# Patient Record
Sex: Male | Born: 1955 | Race: White | Hispanic: No | Marital: Married | Smoking: Never smoker
Health system: Southern US, Community
[De-identification: ages and names within clinical notes are randomized; demographics above are authoritative.]

## PROBLEM LIST (undated history)

## (undated) DIAGNOSIS — G8921 Chronic pain due to trauma: Secondary | ICD-10-CM

## (undated) HISTORY — PX: BACK SURGERY: SHX140

## (undated) HISTORY — PX: TOTAL KNEE ARTHROPLASTY: SHX125

## (undated) SURGERY — CARDIOVERSION (CATH LAB)
Anesthesia: Moderate Sedation | Laterality: Right

---

## 1999-05-02 ENCOUNTER — Encounter: Admission: RE | Admit: 1999-05-02 | Discharge: 1999-05-02 | Payer: Self-pay | Admitting: Orthopedic Surgery

## 1999-05-02 ENCOUNTER — Encounter: Payer: Self-pay | Admitting: Orthopedic Surgery

## 1999-05-18 ENCOUNTER — Ambulatory Visit (HOSPITAL_COMMUNITY): Admission: RE | Admit: 1999-05-18 | Discharge: 1999-05-18 | Payer: Self-pay | Admitting: Specialist

## 1999-05-18 ENCOUNTER — Encounter: Payer: Self-pay | Admitting: Specialist

## 1999-05-24 ENCOUNTER — Encounter (INDEPENDENT_AMBULATORY_CARE_PROVIDER_SITE_OTHER): Payer: Self-pay

## 1999-05-24 ENCOUNTER — Inpatient Hospital Stay (HOSPITAL_COMMUNITY): Admission: RE | Admit: 1999-05-24 | Discharge: 1999-05-28 | Payer: Self-pay | Admitting: Orthopedic Surgery

## 1999-07-04 ENCOUNTER — Inpatient Hospital Stay (HOSPITAL_COMMUNITY): Admission: RE | Admit: 1999-07-04 | Discharge: 1999-07-05 | Payer: Self-pay | Admitting: Orthopedic Surgery

## 1999-11-23 ENCOUNTER — Ambulatory Visit (HOSPITAL_COMMUNITY): Admission: RE | Admit: 1999-11-23 | Discharge: 1999-11-23 | Payer: Self-pay | Admitting: Specialist

## 1999-11-30 ENCOUNTER — Encounter: Payer: Self-pay | Admitting: Specialist

## 1999-11-30 ENCOUNTER — Ambulatory Visit (HOSPITAL_COMMUNITY): Admission: RE | Admit: 1999-11-30 | Discharge: 1999-11-30 | Payer: Self-pay | Admitting: Specialist

## 2001-05-25 ENCOUNTER — Encounter: Admission: RE | Admit: 2001-05-25 | Discharge: 2001-05-25 | Payer: Self-pay | Admitting: Orthopaedic Surgery

## 2001-05-25 ENCOUNTER — Encounter: Payer: Self-pay | Admitting: Orthopaedic Surgery

## 2002-03-26 ENCOUNTER — Encounter: Payer: Self-pay | Admitting: Orthopaedic Surgery

## 2002-03-26 ENCOUNTER — Encounter: Admission: RE | Admit: 2002-03-26 | Discharge: 2002-03-26 | Payer: Self-pay | Admitting: Orthopaedic Surgery

## 2002-05-26 ENCOUNTER — Encounter: Payer: Self-pay | Admitting: Orthopaedic Surgery

## 2002-05-28 ENCOUNTER — Encounter: Payer: Self-pay | Admitting: Orthopaedic Surgery

## 2002-05-28 ENCOUNTER — Inpatient Hospital Stay (HOSPITAL_COMMUNITY): Admission: RE | Admit: 2002-05-28 | Discharge: 2002-06-04 | Payer: Self-pay | Admitting: Orthopaedic Surgery

## 2002-05-31 ENCOUNTER — Encounter: Payer: Self-pay | Admitting: Orthopaedic Surgery

## 2003-11-18 ENCOUNTER — Encounter: Admission: RE | Admit: 2003-11-18 | Discharge: 2003-11-18 | Payer: Self-pay | Admitting: Orthopaedic Surgery

## 2004-10-26 ENCOUNTER — Ambulatory Visit: Payer: Self-pay

## 2006-05-26 ENCOUNTER — Emergency Department: Payer: Self-pay | Admitting: Emergency Medicine

## 2008-10-31 ENCOUNTER — Encounter: Admission: RE | Admit: 2008-10-31 | Discharge: 2008-10-31 | Payer: Self-pay | Admitting: Orthopedic Surgery

## 2009-01-23 ENCOUNTER — Encounter: Admission: RE | Admit: 2009-01-23 | Discharge: 2009-01-23 | Payer: Self-pay | Admitting: Orthopaedic Surgery

## 2009-11-30 ENCOUNTER — Inpatient Hospital Stay (HOSPITAL_COMMUNITY): Admission: RE | Admit: 2009-11-30 | Discharge: 2009-12-01 | Payer: Self-pay | Admitting: Neurosurgery

## 2010-09-12 LAB — URINALYSIS, ROUTINE W REFLEX MICROSCOPIC
Bilirubin Urine: NEGATIVE
Glucose, UA: NEGATIVE mg/dL
Hgb urine dipstick: NEGATIVE
Ketones, ur: NEGATIVE mg/dL
Specific Gravity, Urine: 1.02 (ref 1.005–1.030)
pH: 6 (ref 5.0–8.0)

## 2010-09-12 LAB — TYPE AND SCREEN: Antibody Screen: NEGATIVE

## 2010-09-12 LAB — DIFFERENTIAL
Basophils Absolute: 0 10*3/uL (ref 0.0–0.1)
Eosinophils Absolute: 0.2 10*3/uL (ref 0.0–0.7)
Eosinophils Relative: 2 % (ref 0–5)
Lymphs Abs: 2.6 10*3/uL (ref 0.7–4.0)
Neutrophils Relative %: 62 % (ref 43–77)

## 2010-09-12 LAB — CBC
Hemoglobin: 14.9 g/dL (ref 13.0–17.0)
MCHC: 34.3 g/dL (ref 30.0–36.0)
MCV: 86.5 fL (ref 78.0–100.0)
RBC: 5.01 MIL/uL (ref 4.22–5.81)
WBC: 9.5 10*3/uL (ref 4.0–10.5)

## 2010-09-12 LAB — COMPREHENSIVE METABOLIC PANEL
ALT: 28 U/L (ref 0–53)
AST: 19 U/L (ref 0–37)
CO2: 27 mEq/L (ref 19–32)
Calcium: 9.3 mg/dL (ref 8.4–10.5)
Chloride: 104 mEq/L (ref 96–112)
Creatinine, Ser: 1.02 mg/dL (ref 0.4–1.5)
GFR calc Af Amer: >60 mL/min (ref >60)
GFR calc non Af Amer: >60 mL/min (ref >60)
Glucose, Bld: 91 mg/dL (ref 70–99)
Total Bilirubin: 1 mg/dL (ref 0.3–1.2)

## 2010-09-12 LAB — PROTIME-INR
INR: 1 (ref 0.00–1.49)
Prothrombin Time: 13.1 seconds (ref 11.6–15.2)

## 2010-09-12 LAB — ABO/RH: ABO/RH(D): O POS

## 2010-11-11 NOTE — Discharge Summary (Signed)
NAME:  Bradley Nielsen, Bradley Nielsen                         ACCOUNT NO.:  [id number]   MEDICAL RECORD NO.:  [id number]                   PATIENT TYPE:  INP   LOCATION:  5023                                 FACILITY:  [facility]   PHYSICIAN:  Verlin Fester, P.A.                 DATE OF BIRTH:  03-27-1956   DATE OF ADMISSION:  05/28/2002  DATE OF DISCHARGE:  06/04/2002                                 DISCHARGE SUMMARY   ADMITTING DIAGNOSIS:  Degenerative disk disease, spinal stenosis, at L4-S1;  otherwise healthy.   DISCHARGE DIAGNOSES:  1. Status post posterior spinal fusion L5-S1.  2. Left L4 radiculitis.  3. Postoperative hemorrhagic anemia that did require transfusion.   PROCEDURE:  Posterior spinal fusion L4-S1, transforaminal lumbar interbody  fusion at L4-5 and L5-S1, local and allograft bone graft.   SURGEON:  Dr. Sharolyn Douglas   ASSISTANT:  Verlin Fester, P.A.-C.   ANESTHESIA:  General anesthesia was used.   CONSULTS:  [facility].   BRIEF HISTORY:  The patient is a 55 year old male who has been treated by  Dr. Noel Gerold with disabling back and bilateral leg pain, left much greater than  the right.  He has degenerative disk disease and spinal stenosis at L4-5 and  5-1.  He discussed surgery on several occasions.  In fact, he has also  talked to two other physicians about surgery dating back to 2000.  He has  gotten to the point that the pain is severe in nature, is limiting his  activities, and limiting his ability to exercise and go about things he  enjoys.  It is severe in nature and keeps him up at night as well.  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  Risks and benefits of the  proposed surgery were discussed with the patient by Dr. Sharolyn Douglas as well as  myself.  He indicated understanding and opted to proceed.   LABORATORY DATA:  On May 26, 2002 CBC was within normal limits with the  exception of hematocrit of 38.9.  After admission on December 4 CBC was  within normal limits with the exception of hemoglobin of 10.6 and hematocrit  30.5.  Hemoglobin and hematocrit were monitored for two more days  postoperatively, reached a low of 9.6 and 27.7 respectively on December 6.  UA from May 29, 2002 showed moderate hemoglobin, rbc's of 10-20,  otherwise negative.  Blood type from May 26, 2002 shows type O, Rh  positive, antibody screen negative.  Urine culture from December 4 showed no  growth at one day.  EKG from May 26, 2002 showed normal sinus rhythm,  read by Dr. Nicki Guadalajara.  X-rays from May 28, 2002 of his lumbar spine  showed good position of pedicle screws at L4-5 and S1 bilaterally.  On  December 6, CT of the spine without contrast showed spinal fixation higher  up in satisfactory position.  No definite impingement on neural structures.  Soft tissue material in the central canal somewhat obscuring the thecal sac  and nerve roots at L4-5 and 5-1, particularly on the left at L5-S1. Etiology  for the soft tissue material is uncertain; it could represent hemorrhage,  has a density similar to that of blood, and likely represents hematoma  causing mass effect on the left S1 nerve root.   HOSPITAL COURSE:  Following admission on May 28, 2002 the patient was  taken to the operating room for the above-listed procedure.  He tolerated  the procedure very well, not having any intraoperative complications.  He  was transported to the recovery room in stable condition.  Postoperatively  he was treated with appropriate antibiotic course and he completed this  without difficulty.  His diet was slowly advanced after flatus and did not  have any difficulty in this regard.  Breath sound incentive spirometry was  utilized to decrease the chance of any pulmonary complications.   Neurovascular checks were checked preoperatively and throughout his hospital  course.  He did have some tingling in his left toes and had difficulty with  dorsiflexion postoperatively.  This continued throughout his hospital stay.  It did slightly improve and approximately 3/5 strength at discharge.  Pain  control was obtained utilizing a combination of PCA as well as p.o.  analgesics and medications were adjusted numerous times throughout his stay,  and was adequate.  Physical therapy and occupational therapy consulted to  work with the patient on progressive ambulation and back precautions.  He  did well with them, progressing up to the point that he was independent and  safe for discharge home prior to being discharged.  The patient's operative  dressing was taken off on postoperative day #2 and revealed good-looking  incision.  Hemovac drains were discontinued at this time as well without any  difficulty.  Daily dressing changes were continued throughout his hospital  stay and remained intact without difficulty.  Secondary to his paresthesia  and decreased strength in his lower extremity he was started on Decadron 4  mg IV q.12h. x2 doses on postoperative day #1 and a Medrol Dosepak on  postoperative day #3.  By postoperative day #4 he was continuing to have  these symptoms.  Therefore, an ankle orthotic was ordered which did assist  the patient with his ambulation.  He was also transfused 2 units of packed  red blood cells on June 01, 2002 secondary to hemoglobin of 9.6.  Discharge planning was consulted to assist with arranging home needs, as he  was progressing along well enough that he likely would not need rehab,  although rehab consult was ordered on June 02, 2002 and he was expected  to do well enough that he would be able to go home with home health physical  therapy and outpatient rehab.  By June 04, 2002 the patient had met orthopedic goals, was medically stable and  ready for discharge.  He was  neurovascularly intact.  Pulses were intact and equal.  Motor function  showed he continued to have 3/5 dorsiflexion in the EHL on the left, 4/5  plantar flexion on the right.  He was 5/5 throughout.  Sensation was intact  throughout to light touch.  Incision looked good without any signs or  symptoms or infection.  Vital signs were stable.  He was afebrile.   DISCHARGE PLAN:  A 55 year old male status post L4-S1 posterior spinal  fusion with left L4 radiculitis.   1. Activity is weightbearing as tolerated, progressive ambulation.  2. Dressing changes daily.  3. Brace on when up to both his back as well as his left lower extremity.  4. No lifting greater than 5 pounds.  5. Back precautions were discussed.  6. Medications:  Percocet, Robaxin, Ambien, Vioxx, Neurontin, over-the-     counter laxative, and over-the-counter Tylenol as needed.  7. Diet:  Home diet as tolerated.  8. Follow up in one week with Dr. Sharolyn Douglas; instructed to call for an     appointment.   CONDITION ON DISCHARGE:  Stable and improved.   DISPOSITION:  The patient is being discharged to his home with Genevieve Norlander for  home health physical therapy and occupational therapy.                                               Verlin Fester, P.A.    CM/MEDQ  D:  07/25/2002  T:  07/25/2002  Job:  [id number]

## 2010-11-11 NOTE — Op Note (Signed)
NAME:  Bradley Nielsen, Bradley Nielsen                         ACCOUNT NO.:  [id number]   MEDICAL RECORD NO.:  [id number]                   PATIENT TYPE:  INP   LOCATION:  5023                                 FACILITY:  [facility]   PHYSICIAN:  Sharolyn Douglas, M.D.                     DATE OF BIRTH:  1955-12-28   DATE OF PROCEDURE:  05/28/2002  DATE OF DISCHARGE:                                 OPERATIVE REPORT   PREOPERATIVE DIAGNOSIS:  1. Degenerative disk disease L4-5 and L5-S1.  2. Bilateral lower extremity radiculopathy, left greater than right.   POSTOPERATIVE DIAGNOSIS:  1. Degenerative disk disease L4-5 and L5-S1.  2. Bilateral lower extremity radiculopathy, left greater than right.   PROCEDURE:  1. Lumbar laminotomy with decompression of  the left L5 and S1 nerve roots.  2. Posterior  spinal arthrodesis L4 through S1.  3. Pedicle screw instrumentation segmental L4 through S1 utilizing the     [employer] system.  4. Transforaminal lumbar interbody fusion at L4-5 and L5-S1 with placement     of two NuVasive allograft prosthesis spacers.  5. Right posterior iliac crest bone graft.  6. Local autogenous bone graft supplemented with 10 cc of AlloMatrix C     allograft.  7. Neural monitoring utilizing free running and triggered EMGs.   SURGEON:  Sharolyn Douglas, M.D.   ASSISTANT:  Verlin Fester, P.A.   ANESTHESIA:  General endotracheal anesthesia.   COMPLICATIONS:  None.   INDICATIONS:  The patient is a 55 year old male who I have treating for  almost a year secondary to disabling back and bilateral lower extremity  pain, left greater than right. His plain x-rays and MRI showed degenerative  changes at L4-5 and L5-S1 with moderate to severe central and lateral recess  stenosis at the L4-5 level. At L5-S1 there is a broad-based central  protrusion which indents the thecal sac. The risks, benefits and  alternatives to decompression and fusion of L4 to the sacrum were  extensively  reviewed with the patient and he elected to proceed in hopes of  improving his symptomatology.   DESCRIPTION OF PROCEDURE:  The patient was  properly identified in the  holding area and was taken to the operating room. He underwent general  endotracheal anesthesia without difficulty. He was given prophylactic IV  antibiotics. He was carefully turned prone onto the [employer] four poster  positioning frame. All bony prominences were padded. The face and eyes were  protected at all times. The back was prepped and draped in the usual sterile  fashion.   A midline incision was made from L3 to the sacrum. Dissection was carried  down to the deep fascia. The deep fascia was incised. The paraspinal muscles  were stripped subperiosteally up to the tips of the transverse process of L4-  5 and S1. Care was taken to protect the L3-4 facet  joint capsule.  Intraoperative x-ray was taken to confirm our location.   Once all soft tissue was cleared of the spinous process, lamina and  transverse processes, pedicle screws were placed using an anatomic probing  technique. Each pedicle starting point was identified based on anatomic  landmarks. The pedicle was then cannulated using a Steffee pedicle probe.  All five walls of the pedicle were then palpated utilizing a ball-tipped  feeler to confirm no breeches.   The pedicle was then tapped and palpated once again, and then the pedicle  screw was placed. We placed 6.5 screws at L4 and 5 and 7.5 mm screws in the  sacrum. Intraoperative fluoroscopy showed good placement of the screws in  both the AP and lateral planes. We monitored free running EMGs throughout  the pedicle screw placement and there were no changes. After the pedicle  screws were placed we utilized triggered EMGs, and in each  case we had  greater than 20 milliamp response to testing intraosseous placement of the  screws.   At this point we turned our attention to decompressing the left L5  and S2  nerve roots. We performed facetectomies on the left at L4-5 and L5-S1 by  osteotomizing the pars and removing the inferior articular process. The  superior articular process was then osteotomized flush with the pedicle,  completing the facetectomy. We identified the traversing root at L4-5 and L5-  S1 and decompressed this out its respective foramen. When we were done, both  the L5 and S1 nerve roots  were completely free from their takeoff out the 4-  5 and 5-1 foramen.   We then turned our attention to  performing transforaminal lumbar interbody  fusions at 4-5 and 5-1. The disk was identified and the exiting root was  gently elevated cephalad. Care was taken to keep the root in its soft tissue  envelope. An  annulotomy was performed and a radical  diskectomy completed  across to the other side utilizing specialized T-lift instrumentation from  the [facility] set. The end plates were scraped to remove the cartilaginous  material. The interspace was  distracted at L4-5 up to 12 mm and at L5-S1 to  10 mm.   At this point we turned our attention to obtaining bone graft from the right  posterior iliac crest. A separate fascial incision was utilized directly  over the posterior superior iliac spine. A dissection was carried down  through the deep fascia. The cap of the PSIS was removed using a Leksell  rongeur and copious amounts of bone graft were removed from between the  tables of the crest. The wound was irrigated. The deep fascia was closed  with a running #1 Vicryl suture.   We then turned our attention back to the T-lift. Copious amounts of  posterior iliac crest bone graft were packed into the disk  spaces at L4-5  and L5-S1. We then carefully impacted the NuVasive allograft prosthesis  spacers at 4-5. We used the 12 mm prosthesis and at 5-1 we used a 10 mm  prosthesis. The grafts were then chipped across the midline using a specialized tamp. We confirmed good positioning of  the inner body grafts  utilizing intraoperative fluoroscopy.   It should be noted throughout the T-lift procedure we monitored free running  EMGs and there were no deleterious changes. There were no apparent injuries  to the exiting nerve roots. Bleeding was  controlled with bipolar  electrocautery and Gelfoam.   We then placed our rods  bilaterally and applied general compression across  the 4-5 and 5-1 segments and then locked the rods in position utilizing the  appropriate locking caps. The wound was copiously irrigated. We decorticated  the lamina on the right side as well as the transverse processes bilaterally  at L4-5 in the sacral ala.   The remaining bone graft as well as local autologous bone that had been  collected from the laminotomy and facetectomy was morselized and packed into  the lateral gutters. We also packed bone in the inner laminar space on the  right between L4-5 and L5 in the sacrum. Then 10 cc of allograft, AlloMatrix  C was then packed on top  of the autogenous bone graft. A deep Hemovac drain  was placed.   The deep fascia was closed with a running #1 Vicryl suture. The subcutaneous  layer was  closed with a 2-0 Vicryl interrupted, followed by running 3-0  nylon on the skin. A sterile dressing was applied.   The patient was  turned supine and extubated without difficulty.  He was  transferred to the recovery room able to move his upper and lower  extremities.                                               Sharolyn Douglas, M.D.    MC/MEDQ  D:  05/29/2002  T:  05/29/2002  Job:  [id number]

## 2010-11-11 NOTE — H&P (Signed)
NAME:  Bradley Nielsen, Bradley Nielsen                         ACCOUNT NO.:  [id number]   MEDICAL RECORD NO.:  [id number]                   PATIENT TYPE:  INP   LOCATION:  5023                                 FACILITY:  [facility]   PHYSICIAN:  Sharolyn Douglas, M.D.                     DATE OF BIRTH:  February 22, 1956   DATE OF ADMISSION:  05/28/2002  DATE OF DISCHARGE:                                HISTORY & PHYSICAL   CHIEF COMPLAINT:  Back and left lower extremity pain.   HISTORY OF PRESENT ILLNESS:  The patient is a 55 year old male who has been  treated by Dr. Noel Nielsen for over a year now for disabling back pain and  bilateral leg pain, left much greater than right.  He has degenerative disc  disease and spinal stenosis at L4-5 and 5-1.  He discussed surgery on  several occasions.  In fact, he has also talked to two other physicians  about surgery dating back to 2000.  He has gotten to the point that the pain  is severe in nature.  It is limiting his activities and limiting his ability  to exercise and go about things that he enjoys.  It is severe in nature and  keeps him up at night as well.  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  Risks and benefits of proposed surgery were discussed with the  patient by Dr. Noel Nielsen as well as myself.  He indicates understanding and opts  to proceed.   ALLERGIES:  No known drug allergies.   MEDICATIONS:  1. Vicodin 7.5 mg.  2. Ambien p.r.n.   PAST MEDICAL HISTORY:  He is healthy.   PAST SURGICAL HISTORY:  He had left knee x9 surgeries including most  significant being an ACL surgery in 1991, total knee in 1997, total knee  revision in 2003.   SOCIAL HISTORY:  The patient denies tobacco use, denies alcohol use.  He is  married and has three children.  He is not able to work secondary to his  back as well as  his knee.   FAMILY HISTORY:  Noncontributory.   REVIEW OF SYSTEMS:  The patient denies any fevers, chills, sweats, bleeding  tendencies.  CNS:  Denies blurred vision, double vision, seizures,  headaches, paralysis.  CARDIOVASCULAR:  No chest pain, angina, orthopnea,  claudication, or palpitations.  PULMONARY:  No shortness of breath,  productive cough or hemoptysis.  GI:  Denies nausea, vomiting, constipation,  diarrhea, melena or bloody stools.  GU:  No dysuria, hematuria or discharge.  MUSCULOSKELETAL:  As per HPI.   PHYSICAL EXAMINATION:  GENERAL APPEARANCE:  The patient is a 55 year old  white male who is alert and  oriented, in no acute distress, well-nourished,  well groomed, appears stated age, pleasant and cooperative through  examination.  VITAL SIGNS:  Blood pressure 130/86, respiratory rate 16 and nonlabored,  pulse 82 and regular.  HEENT:  Head is normocephalic and atraumatic.  Pupils are equal, round and  reactive.  Extraocular movements intact.  Nose patent.  Pharynx is clear.  NECK:  Soft to palpation.  No bruits appreciated.  No lymphadenopathy or  thyromegaly noted.  CHEST:  Clear to auscultation.  No wheezing, rhonchi or rales.  BREASTS:  Not pertinent and not performed.  CARDIOVASCULAR:  S1 and S2 is regular rate and rhythm with no murmurs or  gallops noted.  ABDOMEN:  Soft to palpation, nondistended, nontender and no organomegaly  noted.  Positive bowel sounds throughout.  GU:  Not pertinent and not performed.  EXTREMITIES:  Motor function is grossly intact.  She does have some weakness  noted in Dr. Wells Guiles office note, to the left lower extremity.  Sensation is  grossly intact to touch.  Pulses are intact to medial and dorsalis pedis,  posterior tibialis bilaterally.  Skin is intact without lesions or rashes.   IMPRESSION:  Degenerative disc disease, spinal stenosis L4 to S1.   PLAN:  1. The patient will be admitted to [facility] on      May 28, 2002, for posterior spinal fusion L4 to S1 to be done by Dr.     Noel Nielsen.  2. The patient to donate two units of autologous blood.     Verlin Fester, P.A.                       Sharolyn Douglas, M.D.    CM/MEDQ  D:  05/29/2002  T:  05/29/2002  Job:  [id number]

## 2012-04-15 ENCOUNTER — Other Ambulatory Visit: Payer: Self-pay | Admitting: Neurosurgery

## 2012-04-15 DIAGNOSIS — M545 Low back pain: Secondary | ICD-10-CM

## 2012-04-16 ENCOUNTER — Ambulatory Visit
Admission: RE | Admit: 2012-04-16 | Discharge: 2012-04-16 | Disposition: A | Discharge status: Home / Self Care | Payer: Medicare Other | Source: Ambulatory Visit | Attending: Neurosurgery | Admitting: Neurosurgery

## 2012-04-16 DIAGNOSIS — M545 Low back pain: Secondary | ICD-10-CM

## 2015-11-18 ENCOUNTER — Ambulatory Visit (INDEPENDENT_AMBULATORY_CARE_PROVIDER_SITE_OTHER)
Admission: EM | Admit: 2015-11-18 | Discharge: 2015-11-18 | Disposition: A | Discharge status: Hospice | Payer: Medicare Other | Source: Home / Self Care | Attending: Family Medicine | Admitting: Family Medicine

## 2015-11-18 ENCOUNTER — Encounter
Admission: EM | Disposition: A | Discharge status: Home Health | Payer: Self-pay | Source: Home / Self Care | Attending: Internal Medicine

## 2015-11-18 ENCOUNTER — Inpatient Hospital Stay
Admit: 2015-11-18 | Discharge: 2015-11-18 | Disposition: A | Discharge status: Home / Self Care | Payer: Medicare Other | Attending: Cardiovascular Disease | Admitting: Cardiovascular Disease

## 2015-11-18 ENCOUNTER — Encounter: Payer: Self-pay | Admitting: Gynecology

## 2015-11-18 ENCOUNTER — Encounter: Payer: Self-pay | Admitting: Emergency Medicine

## 2015-11-18 ENCOUNTER — Inpatient Hospital Stay
Admission: EM | Admit: 2015-11-18 | Discharge: 2015-11-22 | DRG: 246 | Disposition: A | Discharge status: Home Health | Payer: Medicare Other | Attending: Internal Medicine | Admitting: Internal Medicine

## 2015-11-18 DIAGNOSIS — I4891 Unspecified atrial fibrillation: Secondary | ICD-10-CM | POA: Diagnosis present

## 2015-11-18 DIAGNOSIS — R0789 Other chest pain: Secondary | ICD-10-CM | POA: Diagnosis present

## 2015-11-18 DIAGNOSIS — E860 Dehydration: Secondary | ICD-10-CM | POA: Diagnosis present

## 2015-11-18 DIAGNOSIS — Z96659 Presence of unspecified artificial knee joint: Secondary | ICD-10-CM | POA: Diagnosis present

## 2015-11-18 DIAGNOSIS — I350 Nonrheumatic aortic (valve) stenosis: Secondary | ICD-10-CM | POA: Diagnosis present

## 2015-11-18 DIAGNOSIS — I429 Cardiomyopathy, unspecified: Secondary | ICD-10-CM | POA: Diagnosis present

## 2015-11-18 DIAGNOSIS — G4733 Obstructive sleep apnea (adult) (pediatric): Secondary | ICD-10-CM | POA: Diagnosis present

## 2015-11-18 DIAGNOSIS — Z981 Arthrodesis status: Secondary | ICD-10-CM | POA: Diagnosis not present

## 2015-11-18 DIAGNOSIS — I5021 Acute systolic (congestive) heart failure: Secondary | ICD-10-CM | POA: Diagnosis present

## 2015-11-18 DIAGNOSIS — Z808 Family history of malignant neoplasm of other organs or systems: Secondary | ICD-10-CM | POA: Diagnosis not present

## 2015-11-18 DIAGNOSIS — R7989 Other specified abnormal findings of blood chemistry: Secondary | ICD-10-CM

## 2015-11-18 DIAGNOSIS — J9600 Acute respiratory failure, unspecified whether with hypoxia or hypercapnia: Secondary | ICD-10-CM | POA: Diagnosis not present

## 2015-11-18 DIAGNOSIS — I480 Paroxysmal atrial fibrillation: Secondary | ICD-10-CM | POA: Diagnosis not present

## 2015-11-18 DIAGNOSIS — I471 Supraventricular tachycardia: Secondary | ICD-10-CM

## 2015-11-18 DIAGNOSIS — R0600 Dyspnea, unspecified: Secondary | ICD-10-CM | POA: Diagnosis not present

## 2015-11-18 DIAGNOSIS — M545 Low back pain: Secondary | ICD-10-CM | POA: Diagnosis present

## 2015-11-18 DIAGNOSIS — Z6835 Body mass index (BMI) 35.0-35.9, adult: Secondary | ICD-10-CM

## 2015-11-18 DIAGNOSIS — I214 Non-ST elevation (NSTEMI) myocardial infarction: Principal | ICD-10-CM

## 2015-11-18 DIAGNOSIS — I2510 Atherosclerotic heart disease of native coronary artery without angina pectoris: Secondary | ICD-10-CM | POA: Diagnosis present

## 2015-11-18 DIAGNOSIS — E669 Obesity, unspecified: Secondary | ICD-10-CM | POA: Diagnosis present

## 2015-11-18 DIAGNOSIS — D72829 Elevated white blood cell count, unspecified: Secondary | ICD-10-CM | POA: Diagnosis present

## 2015-11-18 DIAGNOSIS — R778 Other specified abnormalities of plasma proteins: Secondary | ICD-10-CM

## 2015-11-18 DIAGNOSIS — F419 Anxiety disorder, unspecified: Secondary | ICD-10-CM | POA: Diagnosis present

## 2015-11-18 DIAGNOSIS — Z8249 Family history of ischemic heart disease and other diseases of the circulatory system: Secondary | ICD-10-CM

## 2015-11-18 DIAGNOSIS — N179 Acute kidney failure, unspecified: Secondary | ICD-10-CM | POA: Diagnosis present

## 2015-11-18 DIAGNOSIS — Z79899 Other long term (current) drug therapy: Secondary | ICD-10-CM | POA: Diagnosis not present

## 2015-11-18 HISTORY — PX: CARDIAC CATHETERIZATION: SHX172

## 2015-11-18 HISTORY — DX: Chronic pain due to trauma: G89.21

## 2015-11-18 LAB — CBC WITH DIFFERENTIAL/PLATELET
Basophils Absolute: 0 10*3/uL (ref 0–0.1)
EOS ABS: 0 10*3/uL (ref 0–0.7)
Eosinophils Relative: 0 %
HEMATOCRIT: 43.4 % (ref 40.0–52.0)
HEMOGLOBIN: 14.6 g/dL (ref 13.0–18.0)
LYMPHS ABS: 1.5 10*3/uL (ref 1.0–3.6)
Lymphocytes Relative: 9 %
MCH: 28.7 pg (ref 26.0–34.0)
MCHC: 33.6 g/dL (ref 32.0–36.0)
MCV: 85.3 fL (ref 80.0–100.0)
Monocytes Absolute: 1.4 10*3/uL — ABNORMAL HIGH (ref 0.2–1.0)
NEUTROS ABS: 14 10*3/uL — AB (ref 1.4–6.5)
Platelets: 185 10*3/uL (ref 150–440)
RBC: 5.08 MIL/uL (ref 4.40–5.90)
RDW: 14.1 % (ref 11.5–14.5)
WBC: 16.9 10*3/uL — AB (ref 3.8–10.6)

## 2015-11-18 LAB — BASIC METABOLIC PANEL
Anion gap: 9 (ref 5–15)
BUN: 21 mg/dL — ABNORMAL HIGH (ref 6–20)
CHLORIDE: 107 mmol/L (ref 101–111)
CO2: 24 mmol/L (ref 22–32)
Calcium: 8.9 mg/dL (ref 8.9–10.3)
Creatinine, Ser: 1.3 mg/dL — ABNORMAL HIGH (ref 0.61–1.24)
GFR calc non Af Amer: 59 mL/min — ABNORMAL LOW (ref >60)
Glucose, Bld: 126 mg/dL — ABNORMAL HIGH (ref 65–99)
POTASSIUM: 3.8 mmol/L (ref 3.5–5.1)
SODIUM: 140 mmol/L (ref 135–145)

## 2015-11-18 LAB — TROPONIN I
TROPONIN I: 3.35 ng/mL — AB (ref <0.031)
TROPONIN I: 4.57 ng/mL — AB (ref <0.031)
Troponin I: 5.25 ng/mL — ABNORMAL HIGH (ref <0.031)

## 2015-11-18 LAB — TSH: TSH: 1.458 u[IU]/mL (ref 0.350–4.500)

## 2015-11-18 LAB — APTT: aPTT: 32 seconds (ref 24–36)

## 2015-11-18 LAB — PROTIME-INR
INR: 1.19
Prothrombin Time: 15.3 seconds — ABNORMAL HIGH (ref 11.4–15.0)

## 2015-11-18 LAB — HEPARIN LEVEL (UNFRACTIONATED)

## 2015-11-18 LAB — LIPID PANEL
CHOLESTEROL: 174 mg/dL (ref 0–200)
HDL: 33 mg/dL — ABNORMAL LOW (ref >40)
LDL Cholesterol: 116 mg/dL — ABNORMAL HIGH (ref 0–99)
Total CHOL/HDL Ratio: 5.3 RATIO
Triglycerides: 127 mg/dL (ref <150)
VLDL: 25 mg/dL (ref 0–40)

## 2015-11-18 LAB — MRSA PCR SCREENING: MRSA by PCR: NEGATIVE

## 2015-11-18 LAB — GLUCOSE, CAPILLARY: GLUCOSE-CAPILLARY: 125 mg/dL — AB (ref 65–99)

## 2015-11-18 SURGERY — LEFT HEART CATH AND CORONARY ANGIOGRAPHY
Anesthesia: Moderate Sedation | Laterality: Right

## 2015-11-18 MED ORDER — DILTIAZEM HCL 25 MG/5ML IV SOLN
20.0000 mg | Freq: Once | INTRAVENOUS | Status: AC
  Administered 2015-11-18: 20 mg via INTRAVENOUS
  Filled 2015-11-18: qty 5

## 2015-11-18 MED ORDER — NITROGLYCERIN 5 MG/ML IV SOLN
INTRAVENOUS | Status: AC
  Filled 2015-11-18: qty 10

## 2015-11-18 MED ORDER — SODIUM CHLORIDE 0.9 % IV SOLN: 250.0000 mL | INTRAVENOUS | Status: DC | PRN

## 2015-11-18 MED ORDER — LORAZEPAM 2 MG/ML IJ SOLN: 2.0000 mg | Freq: Once | INTRAMUSCULAR | Status: DC

## 2015-11-18 MED ORDER — LORAZEPAM 2 MG/ML IJ SOLN
INTRAMUSCULAR | Status: AC
  Administered 2015-11-18: 17:00:00
  Filled 2015-11-18: qty 1

## 2015-11-18 MED ORDER — BIVALIRUDIN BOLUS VIA INFUSION - CUPID
INTRAVENOUS | Status: DC | PRN
  Administered 2015-11-18: 102.075 mg via INTRAVENOUS

## 2015-11-18 MED ORDER — SODIUM CHLORIDE 0.9% FLUSH: 3.0000 mL | INTRAVENOUS | Status: DC | PRN

## 2015-11-18 MED ORDER — TICAGRELOR 90 MG PO TABS
ORAL_TABLET | ORAL | Status: DC | PRN
  Administered 2015-11-18: 180 mg via ORAL

## 2015-11-18 MED ORDER — HYDROCODONE-ACETAMINOPHEN 10-325 MG PO TABS
1.0000 | ORAL_TABLET | Freq: Four times a day (QID) | ORAL | Status: DC | PRN
  Administered 2015-11-18 – 2015-11-22 (×8): 1 via ORAL
  Filled 2015-11-18 (×9): qty 1

## 2015-11-18 MED ORDER — SODIUM CHLORIDE 0.9 % WEIGHT BASED INFUSION: 3.0000 mL/kg/h | INTRAVENOUS | Status: AC

## 2015-11-18 MED ORDER — FUROSEMIDE 10 MG/ML IJ SOLN
INTRAMUSCULAR | Status: AC
  Filled 2015-11-18: qty 2

## 2015-11-18 MED ORDER — DIGOXIN 0.25 MG/ML IJ SOLN
0.2500 mg | INTRAMUSCULAR | Status: AC
  Administered 2015-11-18: 0.25 mg via INTRAVENOUS
  Filled 2015-11-18: qty 1

## 2015-11-18 MED ORDER — FUROSEMIDE 10 MG/ML IJ SOLN
INTRAMUSCULAR | Status: DC | PRN
Start: 2015-11-18 — End: 2015-11-18
  Administered 2015-11-18: 20 mg via INTRAVENOUS

## 2015-11-18 MED ORDER — SODIUM CHLORIDE 0.9 % IV SOLN
0.2500 mg/kg/h | INTRAVENOUS | Status: AC
  Filled 2015-11-18: qty 250

## 2015-11-18 MED ORDER — ADENOSINE 6 MG/2ML IV SOLN
12.0000 mg | Freq: Once | INTRAVENOUS | Status: AC
  Administered 2015-11-18: 12 mg via INTRAVENOUS

## 2015-11-18 MED ORDER — FENTANYL CITRATE (PF) 100 MCG/2ML IJ SOLN
INTRAMUSCULAR | Status: DC | PRN
  Administered 2015-11-18 (×2): 25 ug via INTRAVENOUS

## 2015-11-18 MED ORDER — DIGOXIN 0.25 MG/ML IJ SOLN
0.2500 mg | Freq: Once | INTRAMUSCULAR | Status: AC
  Administered 2015-11-18: 0.25 mg via INTRAVENOUS
  Filled 2015-11-18: qty 1

## 2015-11-18 MED ORDER — BIVALIRUDIN 250 MG IV SOLR
INTRAVENOUS | Status: AC
  Filled 2015-11-18: qty 250

## 2015-11-18 MED ORDER — SODIUM CHLORIDE 0.9% FLUSH: 3.0000 mL | Freq: Two times a day (BID) | INTRAVENOUS | Status: DC

## 2015-11-18 MED ORDER — MIDAZOLAM HCL 2 MG/2ML IJ SOLN
INTRAMUSCULAR | Status: AC
  Filled 2015-11-18: qty 2

## 2015-11-18 MED ORDER — DILTIAZEM HCL 25 MG/5ML IV SOLN: 5.0000 mg | Freq: Once | INTRAVENOUS | Status: DC

## 2015-11-18 MED ORDER — SODIUM CHLORIDE 0.9 % WEIGHT BASED INFUSION: 1.0000 mL/kg/h | INTRAVENOUS | Status: DC

## 2015-11-18 MED ORDER — NITROGLYCERIN 1 MG/10 ML FOR IR/CATH LAB
INTRA_ARTERIAL | Status: DC | PRN
Start: 2015-11-18 — End: 2015-11-18
  Administered 2015-11-18: 200 ug via INTRA_ARTERIAL

## 2015-11-18 MED ORDER — DILTIAZEM HCL 25 MG/5ML IV SOLN
INTRAVENOUS | Status: AC
  Filled 2015-11-18: qty 5

## 2015-11-18 MED ORDER — ASPIRIN 81 MG PO CHEW: 81.0000 mg | CHEWABLE_TABLET | ORAL | Status: DC

## 2015-11-18 MED ORDER — AMIODARONE IV BOLUS ONLY 150 MG/100ML
150.0000 mg | Freq: Once | INTRAVENOUS | Status: AC
  Administered 2015-11-18: 150 mg via INTRAVENOUS
  Filled 2015-11-18: qty 100

## 2015-11-18 MED ORDER — AMIODARONE HCL IN DEXTROSE 360-4.14 MG/200ML-% IV SOLN
60.0000 mg/h | INTRAVENOUS | Status: AC
  Filled 2015-11-18: qty 200

## 2015-11-18 MED ORDER — HEPARIN BOLUS VIA INFUSION
4000.0000 [IU] | Freq: Once | INTRAVENOUS | Status: AC
  Administered 2015-11-18: 4000 [IU] via INTRAVENOUS
  Filled 2015-11-18: qty 4000

## 2015-11-18 MED ORDER — FENTANYL CITRATE (PF) 100 MCG/2ML IJ SOLN
INTRAMUSCULAR | Status: AC
  Filled 2015-11-18: qty 2

## 2015-11-18 MED ORDER — ACETAMINOPHEN 325 MG PO TABS: 650.0000 mg | ORAL_TABLET | ORAL | Status: DC | PRN

## 2015-11-18 MED ORDER — MIDAZOLAM HCL 2 MG/2ML IJ SOLN
INTRAMUSCULAR | Status: DC | PRN
  Administered 2015-11-18: 1 mg via INTRAVENOUS

## 2015-11-18 MED ORDER — HYDROMORPHONE HCL 1 MG/ML IJ SOLN
INTRAMUSCULAR | Status: AC
  Administered 2015-11-18: 1 mg
  Filled 2015-11-18: qty 1

## 2015-11-18 MED ORDER — HEPARIN (PORCINE) IN NACL 100-0.45 UNIT/ML-% IJ SOLN
1250.0000 [IU]/h | INTRAMUSCULAR | Status: DC
  Administered 2015-11-18: 1250 [IU]/h via INTRAVENOUS
  Filled 2015-11-18: qty 250

## 2015-11-18 MED ORDER — ASPIRIN 81 MG PO CHEW
81.0000 mg | CHEWABLE_TABLET | Freq: Every day | ORAL | Status: DC
Start: 2015-11-19 — End: 2015-11-22
  Administered 2015-11-19 – 2015-11-22 (×4): 81 mg via ORAL
  Filled 2015-11-18 (×4): qty 1

## 2015-11-18 MED ORDER — SODIUM CHLORIDE 0.9% FLUSH
3.0000 mL | Freq: Two times a day (BID) | INTRAVENOUS | Status: DC
  Administered 2015-11-18 – 2015-11-21 (×4): 3 mL via INTRAVENOUS

## 2015-11-18 MED ORDER — SODIUM CHLORIDE 0.9 % IV SOLN
INTRAVENOUS | Status: DC
  Administered 2015-11-18 – 2015-11-19 (×2): via INTRAVENOUS

## 2015-11-18 MED ORDER — ONDANSETRON HCL 4 MG/2ML IJ SOLN: 4.0000 mg | Freq: Four times a day (QID) | INTRAMUSCULAR | Status: DC | PRN

## 2015-11-18 MED ORDER — SODIUM CHLORIDE 0.9 % WEIGHT BASED INFUSION: 3.0000 mL/kg/h | INTRAVENOUS | Status: DC

## 2015-11-18 MED ORDER — TICAGRELOR 90 MG PO TABS
ORAL_TABLET | ORAL | Status: AC
  Filled 2015-11-18: qty 2

## 2015-11-18 MED ORDER — ASPIRIN 81 MG PO CHEW
CHEWABLE_TABLET | ORAL | Status: DC | PRN
  Administered 2015-11-18: 324 mg via ORAL

## 2015-11-18 MED ORDER — ONDANSETRON HCL 4 MG/2ML IJ SOLN
INTRAMUSCULAR | Status: AC
  Administered 2015-11-18: 17:00:00
  Filled 2015-11-18: qty 2

## 2015-11-18 MED ORDER — ADENOSINE 6 MG/2ML IV SOLN
6.0000 mg | Freq: Once | INTRAVENOUS | Status: AC
  Administered 2015-11-18: 6 mg via INTRAVENOUS

## 2015-11-18 MED ORDER — MIDAZOLAM HCL 2 MG/2ML IJ SOLN
INTRAMUSCULAR | Status: DC | PRN
  Administered 2015-11-18 (×2): 1 mg via INTRAVENOUS

## 2015-11-18 MED ORDER — AMIODARONE HCL IN DEXTROSE 360-4.14 MG/200ML-% IV SOLN
60.0000 mg/h | INTRAVENOUS | Status: DC
  Administered 2015-11-19 – 2015-11-20 (×4): 60 mg/h via INTRAVENOUS
  Filled 2015-11-18 (×14): qty 200

## 2015-11-18 MED ORDER — DEXTROSE 5 % IV SOLN: 60.0000 mg/h | Freq: Once | INTRAVENOUS | Status: DC

## 2015-11-18 MED ORDER — HEPARIN (PORCINE) IN NACL 2-0.9 UNIT/ML-% IJ SOLN
INTRAMUSCULAR | Status: AC
  Filled 2015-11-18: qty 500

## 2015-11-18 MED ORDER — HYDROMORPHONE HCL 1 MG/ML IJ SOLN
1.0000 mg | Freq: Once | INTRAMUSCULAR | Status: AC
  Administered 2015-11-19: 1 mg via INTRAVENOUS
  Filled 2015-11-18: qty 1

## 2015-11-18 MED ORDER — GABAPENTIN 300 MG PO CAPS
300.0000 mg | ORAL_CAPSULE | Freq: Three times a day (TID) | ORAL | Status: DC
  Administered 2015-11-18 – 2015-11-22 (×11): 300 mg via ORAL
  Filled 2015-11-18 (×11): qty 1

## 2015-11-18 MED ORDER — ZOLPIDEM TARTRATE 5 MG PO TABS
10.0000 mg | ORAL_TABLET | Freq: Every evening | ORAL | Status: DC | PRN
  Administered 2015-11-18 – 2015-11-21 (×4): 10 mg via ORAL
  Filled 2015-11-18 (×4): qty 2

## 2015-11-18 MED ORDER — AMIODARONE HCL IN DEXTROSE 360-4.14 MG/200ML-% IV SOLN
60.0000 mg/h | Freq: Once | INTRAVENOUS | Status: AC
  Administered 2015-11-18: 60 mg/h via INTRAVENOUS
  Filled 2015-11-18: qty 200

## 2015-11-18 MED ORDER — IOPAMIDOL (ISOVUE-300) INJECTION 61%
INTRAVENOUS | Status: DC | PRN
  Administered 2015-11-18: 350 mL via INTRA_ARTERIAL

## 2015-11-18 MED ORDER — SODIUM CHLORIDE 0.9 % IV SOLN
250.0000 mg | INTRAVENOUS | Status: DC | PRN
  Administered 2015-11-18 (×2): 1.75 mg/kg/h via INTRAVENOUS

## 2015-11-18 MED ORDER — ASPIRIN 81 MG PO CHEW
CHEWABLE_TABLET | ORAL | Status: AC
  Filled 2015-11-18: qty 4

## 2015-11-18 MED ORDER — TICAGRELOR 90 MG PO TABS
90.0000 mg | ORAL_TABLET | Freq: Two times a day (BID) | ORAL | Status: DC
  Administered 2015-11-19 – 2015-11-22 (×8): 90 mg via ORAL
  Filled 2015-11-18 (×8): qty 1

## 2015-11-18 SURGICAL SUPPLY — 17 items
BALLN TREK RX 2.5X12 (BALLOONS) ×4
BALLOON TREK RX 2.5X12 (BALLOONS) ×2 IMPLANT
CATH INFINITI 5 FR 3DRC (CATHETERS) ×4 IMPLANT
CATH INFINITI 5FR ANG PIGTAIL (CATHETERS) ×4 IMPLANT
CATH INFINITI 5FR JL4 (CATHETERS) ×4 IMPLANT
CATH INFINITI JR4 5F (CATHETERS) ×4 IMPLANT
CATH VISTA GUIDE 6FR JR4 SH (CATHETERS) ×4 IMPLANT
DEVICE CLOSURE MYNXGRIP 6/7F (Vascular Products) ×4 IMPLANT
DEVICE INFLAT 30 PLUS (MISCELLANEOUS) ×4 IMPLANT
KIT MANI 3VAL PERCEP (MISCELLANEOUS) ×4 IMPLANT
NEEDLE PERC 18GX7CM (NEEDLE) ×4 IMPLANT
PACK CARDIAC CATH (CUSTOM PROCEDURE TRAY) ×4 IMPLANT
SHEATH AVANTI 6FR X 11CM (SHEATH) ×4 IMPLANT
SHEATH PINNACLE 5F 10CM (SHEATH) ×4 IMPLANT
STENT XIENCE ALPINE RX 3.0X18 (Permanent Stent) ×4 IMPLANT
WIRE EMERALD 3MM-J .035X150CM (WIRE) ×8 IMPLANT
WIRE G HI TQ BMW 190 (WIRE) ×4 IMPLANT

## 2015-11-18 NOTE — H&P (Signed)
[employer] - [place] at [facility]   PATIENT NAME: Bradley Nielsen    MR#:  [id number]  DATE OF BIRTH:  [birth year]  DATE OF ADMISSION:  11/18/2015  PRIMARY CARE PHYSICIAN: No PCP Per Patient   REQUESTING/REFERRING PHYSICIAN: Williams  CHIEF COMPLAINT:   Chief Complaint  Patient presents with  . Chest Pain    HISTORY OF PRESENT ILLNESS: Bradley Nielsen  is a 60 y.o. male with a known history of Chronic pain at the back because of injuries and surgeries. For last 1 week he started having chest tightness and episodes of excessive sweating and feeling very weak. He could not sleep at nighttime flat in the bed and he has to stay in the recliner for last 1 week. As the problem was getting worse he went to urgent care Center today, where he was noted to have a heart rate running more than 200 and they gave injection amiodarone which helped to slow it down to  120 and sent him to emergency room for further workup.  He does not follow regularly with her primary care physician but he follows with his neurologist and said that they checked him routinely for his blood pressure and it was always reported to be under control.  Patient also has some complain of constipation for last few days, he denies any urinary symptoms, cough, fever, sputum production. He was able to perform his routine weightbearing and other exercise at gym for last 2 days without any worsening in the chest pain. Though he does not do treadmill or any walking exercise because of his back and knee issues.  PAST MEDICAL HISTORY:   Past Medical History  Diagnosis Date  . Chronic pain due to injury     PAST SURGICAL HISTORY: Past Surgical History  Procedure Laterality Date  . Total knee arthroplasty    . Back surgery      SOCIAL HISTORY:  Social History  Substance Use Topics  . Smoking status: Never Smoker   . Smokeless tobacco: Not on file  . Alcohol Use: No    FAMILY HISTORY:  Family History   Problem Relation Age of Onset  . Throat cancer Father     DRUG ALLERGIES: No Known Allergies  REVIEW OF SYSTEMS:   CONSTITUTIONAL: No fever,Positive for fatigue or weakness.  EYES: No blurred or double vision.  EARS, NOSE, AND THROAT: No tinnitus or ear pain.  RESPIRATORY: No cough, positive for shortness of breath, no wheezing or hemoptysis.  CARDIOVASCULAR: Positive for chest pain, orthopnea, no edema.  GASTROINTESTINAL: No nausea, vomiting, diarrhea or abdominal pain.  GENITOURINARY: No dysuria, hematuria.  ENDOCRINE: No polyuria, nocturia,  HEMATOLOGY: No anemia, easy bruising or bleeding SKIN: No rash or lesion. MUSCULOSKELETAL: No joint pain or arthritis.   NEUROLOGIC: No tingling, numbness, weakness.  PSYCHIATRY: No anxiety or depression.   MEDICATIONS AT HOME:  Prior to Admission medications   Medication Sig Start Date End Date Taking? Authorizing Provider  diclofenac (VOLTAREN) 75 MG EC tablet Take 75 mg by mouth 2 (two) times daily.   Yes Historical Provider, MD  gabapentin (NEURONTIN) 300 MG capsule Take 300 mg by mouth 3 (three) times daily.   Yes Historical Provider, MD  HYDROcodone-acetaminophen (NORCO) 10-325 MG tablet Take 1 tablet by mouth every 6 (six) hours as needed.   Yes Historical Provider, MD  zolpidem (AMBIEN) 10 MG tablet Take 10 mg by mouth at bedtime as needed for sleep.   Yes Historical Provider, MD  PHYSICAL EXAMINATION:   VITAL SIGNS: Blood pressure 117/79, pulse 104, temperature 98.8 F (37.1 C), temperature source Oral, resp. rate 18, height  (1.727 m), weight 136.079 kg (300 lb), SpO2 97 %.  GENERAL:  60 y.o.-year-old Obese patient lying in the bed with no acute distress.  EYES: Pupils equal, round, reactive to light and accommodation. No scleral icterus. Extraocular muscles intact.  HEENT: Head atraumatic, normocephalic. Oropharynx and nasopharynx clear.  NECK:  Supple, no jugular venous distention. No thyroid enlargement, no  tenderness.  LUNGS: Normal breath sounds bilaterally, no wheezing, rales,rhonchi or crepitation. No use of accessory muscles of respiration.  CARDIOVASCULAR: S1, S2 normal. Tachycardia, No murmurs, rubs, or gallops.  ABDOMEN: Soft, nontender, nondistended. Bowel sounds present. No organomegaly or mass.  EXTREMITIES: No pedal edema, cyanosis, or clubbing.  NEUROLOGIC: Cranial nerves II through XII are intact. Muscle strength 5/5 in all extremities. Sensation intact. Gait not checked.  PSYCHIATRIC: The patient is alert and oriented x 3.  SKIN: No obvious rash, lesion, or ulcer.   LABORATORY PANEL:   CBC  Recent Labs Lab 11/18/15 0950  WBC 16.9*  HGB 14.6  HCT 43.4  PLT 185  MCV 85.3  MCH 28.7  MCHC 33.6  RDW 14.1  LYMPHSABS 1.5  MONOABS 1.4*  EOSABS 0.0  BASOSABS 0.0   ------------------------------------------------------------------------------------------------------------------  Chemistries   Recent Labs Lab 11/18/15 0950  NA 140  K 3.8  CL 107  CO2 24  GLUCOSE 126*  BUN 21*  CREATININE 1.30*  CALCIUM 8.9   ------------------------------------------------------------------------------------------------------------------ estimated creatinine clearance is 83.7 mL/min (by C-G formula based on Cr of 1.3). ------------------------------------------------------------------------------------------------------------------ No results for input(s): TSH, T4TOTAL, T3FREE, THYROIDAB in the last 72 hours.  Invalid input(s): FREET3   Coagulation profile  Recent Labs Lab 11/18/15 0950  INR 1.19   ------------------------------------------------------------------------------------------------------------------- No results for input(s): DDIMER in the last 72 hours. -------------------------------------------------------------------------------------------------------------------  Cardiac Enzymes  Recent Labs Lab 11/18/15 0950  TROPONINI 3.35*    ------------------------------------------------------------------------------------------------------------------ Invalid input(s): POCBNP  ---------------------------------------------------------------------------------------------------------------  Urinalysis    Component Value Date/Time   COLORURINE YELLOW 11/29/2009 1454   APPEARANCEUR CLEAR 11/29/2009 1454   LABSPEC 1.020 11/29/2009 1454   PHURINE 6.0 11/29/2009 1454   GLUCOSEU NEGATIVE 11/29/2009 1454   HGBUR NEGATIVE 11/29/2009 1454   BILIRUBINUR NEGATIVE 11/29/2009 1454   KETONESUR NEGATIVE 11/29/2009 1454   PROTEINUR NEGATIVE 11/29/2009 1454   UROBILINOGEN 0.2 11/29/2009 1454   NITRITE NEGATIVE 11/29/2009 1454   LEUKOCYTESUR  11/29/2009 1454    NEGATIVE MICROSCOPIC NOT DONE ON URINES WITH NEGATIVE PROTEIN, BLOOD, LEUKOCYTES, NITRITE, OR GLUCOSE <1000 mg/dL.     RADIOLOGY: No results found.  EKG: Orders placed or performed during the hospital encounter of 11/18/15  . ED EKG  . ED EKG  . EKG 12-Lead  . EKG 12-Lead   Atrial fibrillation with ventricular rate of 110.  IMPRESSION AND PLAN:  * Non-ST elevation MI  Troponin is 3.   May be secondary to stress due to A. fib with RVR.   Cardiologist suggested to start on heparin drip and proceed to cardiac catheterization today.   Further management and medications as per cardiologist Dr. Catheterization.   We will monitor on telemetry and follow serial troponins.   Also check lipid panel.  * Atrial fibrillation with rapid ventricular response   Responded to Cardizem injection, currently on amiodarone drip.   We'll check TSH.   Management per cardiologist.   On heparin IV drip currently.  * Dehydration   We'll give IV  fluid and hydrate him well as he is going for catheterization today.   Mild worsening in renal function compared to previous records.   Monitor after catheterization.   All the records are reviewed and case discussed with ED  provider. Management plans discussed with the patient, family and they are in agreement.  CODE STATUS: Full code Code Status History    This patient does not have a recorded code status. Please follow your organizational policy for patients in this situation.     Condition is critical because of presence with A. fib and RVR with non-ST elevation MI, plan discussed with patient and his wife were present in the room.  TOTAL TIME TAKING CARE OF THIS PATIENT: 50 critical care minutes  Altamese DillingVACHHANI, Narvel Kozub M.D on 11/18/2015   Between 7am to 6pm - Pager - [phone]  After 6pm go to [email or web address] - Social research officer, governmentpassword [email or web address] [facility]  [facility]  Office  [phone]  CC: Primary care physician; No PCP Per Patient   Note: This dictation was prepared with Dragon dictation along with smaller phrase technology. Any transcriptional errors that result from this process are unintentional.

## 2015-11-18 NOTE — Progress Notes (Signed)
Paged Dr Welton FlakesKhan? Question regarding heparin continuous infusion weather to continue or discontinue.

## 2015-11-18 NOTE — ED Notes (Signed)
Pt presents from [facility] where he was seen for chest pressure x 4 days. Per EMS upon pt arrival to UC patient HR > 190. Pt denies chest pressure at this time, c/o chronic back pain at this time. Per EMS pt has had 4 ASA PTA. Pt presents to ED in A-Fib at this time.

## 2015-11-18 NOTE — ED Notes (Signed)
Cardiologist NP at bedside at this time

## 2015-11-18 NOTE — Significant Event (Signed)
Rapid Response Event Note  Overview:      Initial Focused Assessment: Patient very anxious- came back from cardiac cath 2 hrs prior.  HR in afib 140's- 150's- patient nauseated.  Interventions: Patient given 5mg  of cardizem IV, .25 digoxin  IV, 4mg  zofran IV.  Patient stated that he felt much better.  HR sustained in 130's -150's- patient brought to ICU.  Amiodarone ordered to not titrate and keep at 33.683ml rate per Dr. Welton FlakesKhan Cardiology.  Event Summary:   at      at          [facility],Marlet Korte J

## 2015-11-18 NOTE — ED Provider Notes (Addendum)
CSN: [id number]     Arrival date & time 11/18/15  0810 History   First MD Initiated Contact with Patient 11/18/15 [phone]    Nurses notes were reviewed. Chief Complaint  Patient presents with  . Pleurisy    Patient presents with her 3-4 day history of chest tightness. Reports chest tightness and chest heaviness has been going on for the last 3-4 days. He does report being under a lot of stress his son is getting married on June 24 and states that they've had a lot of more social events eating events pain. He probably just over 8. He reports last night he denied start playing with his grandson and I felt little better this morning he was concerned enough they came in to be seen. He denies any history of heart disease before diabetes any history of heart palpitations. He has had multiple hospitalizations and surgeries for both his back knee and knee replacements as well. History of heart disease in the family and some cancers in the family. He never smoke. No known drug allergies.    (Consider location/radiation/quality/duration/timing/severity/associated sxs/prior Treatment) Patient is a 60 y.o. male presenting with chest pain. The history is provided by the patient. No language interpreter was used.  Chest Pain Pain location:  Epigastric Pain quality: pressure and tightness   Pain radiates to:  Does not radiate Pain radiates to the back: no   Onset quality:  Sudden Timing:  Constant Progression:  Unable to specify Chronicity:  New Context: not breathing, not eating and no movement   Relieved by:  Nothing Worsened by:  Nothing tried Associated symptoms: heartburn   Associated symptoms: no anxiety   Risk factors: male sex and obesity   Risk factors: no coronary artery disease, no diabetes mellitus, no hypertension and no prior DVT/PE     Past Medical History  Diagnosis Date  . Chronic pain due to injury    Past Surgical History  Procedure Laterality Date  . Total knee arthroplasty     . Back surgery     No family history on file. Social History  Substance Use Topics  . Smoking status: Never Smoker   . Smokeless tobacco: None  . Alcohol Use: No    Review of Systems  Cardiovascular: Positive for chest pain.  Gastrointestinal: Positive for heartburn.  All other systems reviewed and are negative.   Allergies  Review of patient's allergies indicates no known allergies.  Home Medications   Prior to Admission medications   Medication Sig Start Date End Date Taking? Authorizing Provider  diclofenac (VOLTAREN) 75 MG EC tablet Take 75 mg by mouth 2 (two) times daily.   Yes Historical Provider, MD  gabapentin (NEURONTIN) 300 MG capsule Take 300 mg by mouth 3 (three) times daily.   Yes Historical Provider, MD  HYDROcodone-acetaminophen (NORCO) 10-325 MG tablet Take 1 tablet by mouth every 6 (six) hours as needed.   Yes Historical Provider, MD  zolpidem (AMBIEN) 10 MG tablet Take 10 mg by mouth at bedtime as needed for sleep.   Yes Historical Provider, MD   Meds Ordered and Administered this Visit   Medications  adenosine (ADENOCARD) 6 MG/2ML injection 6 mg (6 mg Intravenous Given 11/18/15 0832)  adenosine (ADENOCARD) 6 MG/2ML injection 12 mg (12 mg Intravenous Given 11/18/15 0840)  adenosine (ADENOCARD) 6 MG/2ML injection 12 mg (12 mg Intravenous Given 11/18/15 0841)    BP 138/87 mmHg  Pulse 118  Resp 16  Ht  (2.007 m)  Wt 300  lb (136.079 kg)  BMI 33.78 kg/m2  SpO2 98% No data found.   Physical Exam  Constitutional: He is oriented to person, place, and time. He appears well-developed and well-nourished. He appears distressed.  HENT:  Head: Normocephalic and atraumatic.  Eyes: Conjunctivae are normal. Pupils are equal, round, and reactive to light.  Neck: Normal range of motion. Neck supple.  Cardiovascular: An irregular rhythm present. Tachycardia present.   Pulmonary/Chest: Effort normal and breath sounds normal.  Abdominal: Soft. Bowel sounds are  normal.  Musculoskeletal: Normal range of motion.  Neurological: He is alert and oriented to person, place, and time.  Skin: Skin is warm and dry.  Psychiatric: He has a normal mood and affect.  Vitals reviewed.   ED Course  Procedures (including critical care time)  Labs Review Labs Reviewed - No data to display  Imaging Review No results found.   Visual Acuity Review  Right Eye Distance:   Left Eye Distance:   Bilateral Distance:    Right Eye Near:   Left Eye Near:    Bilateral Near:         MDM   1. Supraventricular tachycardia (HCC)   2. Atrial fibrillation with rapid ventricular response (HCC)    Patient underwent carotid massage with carotid massage was unable to keep pulse rate under 180. Valsalva maneuver also failed. He was given 4 baby aspirin IV started. Because of hemodynamic instability with blood pressure 119/98 he was finally given 6 mg adenosine and repeated by 12 mg twice. The 12 mg the lower the pulse rate briefly but pulse rate went back up so he was finally given Cardizem and Cardizem finally got the heart rate down under 130.  When Cardizem was given blood pressure also improved to about 126/68. EKG initially showed the tachycardia and was read as A. fib but with the tachycardia A. fib really wasn't appreciated that well. This also was course ST abnormalities which cannot be [place] was due to the tachycardia or due to myocardial ischemia. However EKG repeated at 122 show A. fib with rapid ventricular response and ST abnormalities which look more as if it was inferior with possible inferior subendocardial injury.    ED ECG REPORT I, Sarahjane Matherly H, the attending physician, personally viewed and interpreted this ECG.   Date: 11/18/2015  EKG Time: 8:17:18  Rate: 196  Rhythm: there are no previous tracings available for comparison, atrial fibrillation, rate 190, ST depression in l, ii,AVR, AVF, V4-V6, abnormal EKG  Axis: 41  Intervals:none  ST&T  Change: Marked ST changes throughout the EKG  ED ECG REPORT I, Kayleen Alig H, the attending physician, personally viewed and interpreted this ECG.   Date: 11/18/2015  EKG Time: 08:43:44  Rate: 116  Rhythm: there are no previous tracings available for comparison, atrial fibrillation, rate 116, ST depression in AVF, AVL, V4-6  Axis: 35  Intervals:none  ST&T Change: Possible inferior subendocardial injury   [facility] charge nurse [place] SwiftStephanie informed of the transfer.  Since critckle care was spent with this patient and due to his instability unable to leave the room during this time until [facility]. He still is protocol was followed total time with patient ran between 45 minutes to 60 minutes. During the time EKGs were done rhythm strips were also evaluated and he was given multiple IV medications with monitor response  Hassan RowanEugene Maximilien Hayashi, MD 11/18/15 [id number]  Hassan RowanEugene Eragon Hammond, MD 11/18/15 [phone]

## 2015-11-18 NOTE — ED Notes (Signed)
Pharmacy called for amio drip, will send to ED momentarily.

## 2015-11-18 NOTE — ED Notes (Signed)
Patient c/o few weeks ago chest pain. Per patient gotten worse x 3 days ago. Per patient chest pressure and tension for 3 days.

## 2015-11-18 NOTE — ED Provider Notes (Signed)
[facility] Emergency Department Provider Note    ____________________________________________  Time seen: On EMS arrival  I have reviewed the triage vital signs and the nursing notes.   HISTORY  Chief Complaint Chest Pain   History limited by: Not Limited   HPI Bradley Nielsen is a 60 y.o. male who presents to the emergency department today via EMS from urgent care. The patient went to urgent care because of concerns for chest pain. It is located on the left side. He describes it as being pressure. It has been going on for 4 days. He states he has been constant. It is slightly worse when he walks up steps. Has been accompanied with some shortness of breath with exertion. In addition the patient complains of constipation. States he has not had a bowel movement for the past 4 days. Denies any fevers.   Past Medical History  Diagnosis Date  . Chronic pain due to injury     There are no active problems to display for this patient.   Past Surgical History  Procedure Laterality Date  . Total knee arthroplasty    . Back surgery      Current Outpatient Rx  Name  Route  Sig  Dispense  Refill  . diclofenac (VOLTAREN) 75 MG EC tablet   Oral   Take 75 mg by mouth 2 (two) times daily.         Marland Kitchen. gabapentin (NEURONTIN) 300 MG capsule   Oral   Take 300 mg by mouth 3 (three) times daily.         Marland Kitchen. HYDROcodone-acetaminophen (NORCO) 10-325 MG tablet   Oral   Take 1 tablet by mouth every 6 (six) hours as needed.         . zolpidem (AMBIEN) 10 MG tablet   Oral   Take 10 mg by mouth at bedtime as needed for sleep.           Allergies Review of patient's allergies indicates no known allergies.  No family history on file.  Social History Social History  Substance Use Topics  . Smoking status: Never Smoker   . Smokeless tobacco: Not on file  . Alcohol Use: No    Review of Systems  Constitutional: Negative for fever. Cardiovascular:  Positive for chest pressure Respiratory: Negative for shortness of breath. Gastrointestinal: Negative for abdominal pain, vomiting and diarrhea. Neurological: Negative for headaches, focal weakness or numbness.  10-point ROS otherwise negative.  ____________________________________________   PHYSICAL EXAM:  VITAL SIGNS:   96  --   118/92 mmHg  97 %   Constitutional: Alert and oriented. Well appearing and in no distress. Eyes: Conjunctivae are normal. PERRL. Normal extraocular movements. ENT   Head: Normocephalic and atraumatic.   Nose: No congestion/rhinnorhea.   Mouth/Throat: Mucous membranes are moist.   Neck: No stridor. Hematological/Lymphatic/Immunilogical: No cervical lymphadenopathy. Cardiovascular: Tachycardic. Irregularly irregular rhythm. No murmurs, rubs, or gallops. Respiratory: Normal respiratory effort without tachypnea nor retractions. Breath sounds are clear and equal bilaterally. No wheezes/rales/rhonchi. Gastrointestinal: Soft and nontender. No distention.  Genitourinary: Deferred Musculoskeletal: Normal range of motion in all extremities. No joint effusions.  No lower extremity tenderness nor edema. Neurologic:  Normal speech and language. No gross focal neurologic deficits are appreciated.  Skin:  Skin is warm, dry and intact. No rash noted. Psychiatric: Mood and affect are normal. Speech and behavior are normal. Patient exhibits appropriate insight and judgment.  ____________________________________________    LABS (pertinent positives/negatives)  Labs Reviewed  CBC WITH DIFFERENTIAL/PLATELET - Abnormal; Notable for the following:    WBC 16.9 (*)    Neutro Abs 14.0 (*)    Monocytes Absolute 1.4 (*)    All other components within normal limits  BASIC METABOLIC PANEL - Abnormal; Notable for the following:    Glucose, Bld 126 (*)    BUN 21 (*)    Creatinine, Ser 1.30 (*)    GFR calc non Af Amer 59 (*)    All other components within normal  limits  TROPONIN I - Abnormal; Notable for the following:    Troponin I 3.35 (*)    All other components within normal limits  PROTIME-INR - Abnormal; Notable for the following:    Prothrombin Time 15.3 (*)    All other components within normal limits  APTT  HEPARIN LEVEL (UNFRACTIONATED)  TROPONIN I  TROPONIN I  TROPONIN I  TSH  LIPID PANEL     ____________________________________________   EKG  I, Phineas Semen, attending physician, personally viewed and interpreted this EKG  EKG Time: 0934 Rate: 118 Rhythm: atrial fibrillation Axis: normal Intervals: qtc 417 QRS: narrow ST changes: no st elevation Impression: abnormal ekg   ____________________________________________    RADIOLOGY  None  ____________________________________________   PROCEDURES  Procedure(s) performed: None  Critical Care performed: Yes, see critical care note(s)  CRITICAL CARE Performed by: Phineas Semen   Total critical care time: 35 minutes  Critical care time was exclusive of separately billable procedures and treating other patients.  Critical care was necessary to treat or prevent imminent or life-threatening deterioration.  Critical care was time spent personally by me on the following activities: development of treatment plan with patient and/or surrogate as well as nursing, discussions with consultants, evaluation of patient's response to treatment, examination of patient, obtaining history from patient or surrogate, ordering and performing treatments and interventions, ordering and review of laboratory studies, ordering and review of radiographic studies, pulse oximetry and re-evaluation of patient's condition.  ____________________________________________   INITIAL IMPRESSION / ASSESSMENT AND PLAN / ED COURSE  Pertinent labs & imaging results that were available during my care of the patient were reviewed by me and considered in my medical decision making (see  chart for details).  Patient presents to the emergency department today via EMS from urgent care. Patient in need of A. fib with RVR. Will give further doses of diltiazem to try to slow the rate. Additionally will check blood work.  Patient's heart rate did appear to improve after the diltiazem dose. His troponin was elevated at 3. I discussed with Dr. Park Breed with cardiology who will likely take patient to cath lab. He also reviewed the EKG. Will admit to the hospitalist service.   ____________________________________________   FINAL CLINICAL IMPRESSION(S) / ED DIAGNOSES  Final diagnoses:  Chest pressure  Atrial fibrillation with RVR (HCC)  Elevated troponin     Note: This dictation was prepared with Dragon dictation. Any transcriptional errors that result from this process are unintentional    Phineas Semen, MD 11/18/15 1242

## 2015-11-18 NOTE — Discharge Instructions (Signed)
Atrial Fibrillation °Atrial fibrillation is a type of heartbeat that is irregular or fast (rapid). If you have this condition, your heart keeps quivering in a weird (chaotic) way. This condition can make it so your heart cannot pump blood normally. Having this condition gives a person more risk for stroke, heart failure, and other heart problems. There are different types of atrial fibrillation. Talk with your doctor to learn about the type that you have. °HOME CARE °· Take over-the-counter and prescription medicines only as told by your doctor. °· If your doctor prescribed a blood-thinning medicine, take it exactly as told. Taking too much of it can cause bleeding. If you do not take enough of it, you will not have the protection that you need against stroke and other problems. °· Do not use any tobacco products. These include cigarettes, chewing tobacco, and e-cigarettes. If you need help quitting, ask your doctor. °· If you have apnea (obstructive sleep apnea), manage it as told by your doctor. °· Do not drink alcohol. °· Do not drink beverages that have caffeine. These include coffee, soda, and tea. °· Maintain a healthy weight. Do not use diet pills unless your doctor says they are safe for you. Diet pills may make heart problems worse. °· Follow diet instructions as told by your doctor. °· Exercise regularly as told by your doctor. °· Keep all follow-up visits as told by your doctor. This is important. °GET HELP IF: °· You notice a change in the speed, rhythm, or strength of your heartbeat. °· You are taking a blood-thinning medicine and you notice more bruising. °· You get tired more easily when you move or exercise. °GET HELP RIGHT AWAY IF: °· You have pain in your chest or your belly (abdomen). °· You have sweating or weakness. °· You feel sick to your stomach (nauseous). °· You notice blood in your throw up (vomit), poop (stool), or pee (urine). °· You are short of breath. °· You suddenly have swollen feet  and ankles. °· You feel dizzy. °· Your suddenly get weak or numb in your face, arms, or legs, especially if it happens on one side of your body. °· You have trouble talking, trouble understanding, or both. °· Your face or your eyelid droops on one side. °These symptoms may be an emergency. Do not wait to see if the symptoms will go away. Get medical help right away. Call your local emergency services (911 in the [place] Do not drive yourself to the hospital. °  °This information is not intended to replace advice given to you by your health care provider. Make sure you discuss any questions you have with your health care provider. °  °Document Released: 03/21/2008 Document Revised: 03/03/2015 Document Reviewed: 10/07/2014 °Elsevier Interactive Patient Education ©2016 [employer] ° °

## 2015-11-18 NOTE — Progress Notes (Signed)
High grade lesion mid RCA with Baremetal stenting, advise asp/plavix/coumadin.

## 2015-11-18 NOTE — ED Notes (Addendum)
Report called to Okey Regalarol RN in Cath Lab at this time.

## 2015-11-18 NOTE — ED Notes (Addendum)
Report called to 2A, Abigail RN at this time, made aware that pt is currently in cath lab and will be transported by them once completes procedure. RN Cammy Copabigail verbalized understanding at this time with no further questions

## 2015-11-18 NOTE — ED Notes (Signed)
Pt O2 90 on RA at this time. Pt placed on 2L via [place] at this time.

## 2015-11-18 NOTE — ED Notes (Signed)
Lab called regarding add on, will add on at this time
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
## 2015-11-18 NOTE — ED Notes (Signed)
Report given to Lauryn, RN.  

## 2015-11-18 NOTE — Progress Notes (Signed)
ANTICOAGULATION CONSULT NOTE - Initial Consult  Pharmacy Consult for Heparin Indication: chest pain/ACS  No Known Allergies  Patient Measurements: Height: 5\' 8"  (172.7 cm) Weight: 300 lb (136.079 kg) IBW/kg (Calculated) : 68.4 Heparin Dosing Weight: 95.5 kg   Vital Signs: Temp: 98.8 F (37.1 C) (05/25 0942) Temp Source: Oral (05/25 0942) BP: 117/79 mmHg (05/25 1112) Pulse Rate: 104 (05/25 1112)  Labs:  Recent Labs  11/18/15 0950  HGB 14.6  HCT 43.4  PLT 185  CREATININE 1.30*  TROPONINI 3.35*    Estimated Creatinine Clearance: 83.7 mL/min (by C-G formula based on Cr of 1.3).   Medical History: Past Medical History  Diagnosis Date  . Chronic pain due to injury     Medications:  Scheduled:   Infusions:  . amiodarone    . heparin 1,250 Units/hr (11/18/15 1129)    Assessment: Heparin drip ordered for this 60 yo male presenting to ED with chest pain and SOB.    Goal of Therapy:  Heparin level 0.3-0.7 units/ml   Plan:  Give 4000 units bolus x 1 Start heparin infusion at 1250 units/hr Check anti-Xa level in 6 hours and daily while on heparin Continue to monitor H&H and platelets   HL ordered for 5/25 at 17:30.  Stormy CardKatsoudas,Dru Primeau K, RPh Clinical Pharmacist 11/18/2015,11:40 AM

## 2015-11-19 ENCOUNTER — Inpatient Hospital Stay: Payer: Medicare Other | Admitting: Registered Nurse

## 2015-11-19 ENCOUNTER — Encounter: Payer: Self-pay | Admitting: Cardiovascular Disease

## 2015-11-19 ENCOUNTER — Inpatient Hospital Stay: Payer: Medicare Other

## 2015-11-19 ENCOUNTER — Inpatient Hospital Stay
Admit: 2015-11-19 | Discharge: 2015-11-19 | Disposition: A | Discharge status: Home / Self Care | Payer: Medicare Other | Attending: Cardiovascular Disease | Admitting: Cardiovascular Disease

## 2015-11-19 ENCOUNTER — Encounter
Admission: EM | Disposition: A | Discharge status: Home Health | Payer: Self-pay | Source: Home / Self Care | Attending: Internal Medicine

## 2015-11-19 DIAGNOSIS — G4733 Obstructive sleep apnea (adult) (pediatric): Secondary | ICD-10-CM

## 2015-11-19 DIAGNOSIS — R0600 Dyspnea, unspecified: Secondary | ICD-10-CM

## 2015-11-19 DIAGNOSIS — I4891 Unspecified atrial fibrillation: Secondary | ICD-10-CM

## 2015-11-19 DIAGNOSIS — I214 Non-ST elevation (NSTEMI) myocardial infarction: Principal | ICD-10-CM

## 2015-11-19 HISTORY — PX: TEE WITHOUT CARDIOVERSION: SHX5443

## 2015-11-19 HISTORY — PX: ELECTROPHYSIOLOGIC STUDY: SHX172A

## 2015-11-19 LAB — BASIC METABOLIC PANEL
ANION GAP: 8 (ref 5–15)
BUN: 20 mg/dL (ref 6–20)
CALCIUM: 8 mg/dL — AB (ref 8.9–10.3)
CO2: 28 mmol/L (ref 22–32)
Chloride: 103 mmol/L (ref 101–111)
Creatinine, Ser: 1.35 mg/dL — ABNORMAL HIGH (ref 0.61–1.24)
GFR calc non Af Amer: 56 mL/min — ABNORMAL LOW (ref >60)
Glucose, Bld: 120 mg/dL — ABNORMAL HIGH (ref 65–99)
POTASSIUM: 3.7 mmol/L (ref 3.5–5.1)
Sodium: 139 mmol/L (ref 135–145)

## 2015-11-19 LAB — BLOOD GAS, ARTERIAL
ALLENS TEST (PASS/FAIL): POSITIVE — AB
Acid-Base Excess: 3.2 mmol/L — ABNORMAL HIGH (ref 0.0–3.0)
Bicarbonate: 29.1 mEq/L — ABNORMAL HIGH (ref 21.0–28.0)
EXPIRATORY PAP: 6
FIO2: 0.4
INSPIRATORY PAP: 14
LHR: 12 {breaths}/min
MODE: POSITIVE
O2 SAT: 96.4 %
PO2 ART: 86 mmHg (ref 83.0–108.0)
Patient temperature: 37
pCO2 arterial: 48 mmHg (ref 32.0–48.0)
pH, Arterial: 7.39 (ref 7.350–7.450)

## 2015-11-19 LAB — ECHOCARDIOGRAM COMPLETE
Height: 68 in
Weight: 4800 oz

## 2015-11-19 LAB — CBC
HCT: 41.1 % (ref 40.0–52.0)
HEMOGLOBIN: 13.7 g/dL (ref 13.0–18.0)
MCH: 29.2 pg (ref 26.0–34.0)
MCHC: 33.4 g/dL (ref 32.0–36.0)
MCV: 87.5 fL (ref 80.0–100.0)
Platelets: 159 10*3/uL (ref 150–440)
RBC: 4.69 MIL/uL (ref 4.40–5.90)
RDW: 14.2 % (ref 11.5–14.5)
WBC: 18.1 10*3/uL — ABNORMAL HIGH (ref 3.8–10.6)

## 2015-11-19 SURGERY — CARDIOVERSION (CATH LAB)
Anesthesia: General

## 2015-11-19 MED ORDER — SODIUM CHLORIDE 0.9 % IV SOLN: INTRAVENOUS | Status: DC

## 2015-11-19 MED ORDER — CETYLPYRIDINIUM CHLORIDE 0.05 % MT LIQD
7.0000 mL | Freq: Two times a day (BID) | OROMUCOSAL | Status: DC
  Administered 2015-11-19 – 2015-11-22 (×5): 7 mL via OROMUCOSAL

## 2015-11-19 MED ORDER — SODIUM CHLORIDE 0.9% FLUSH
3.0000 mL | INTRAVENOUS | Status: DC | PRN
  Administered 2015-11-21: 3 mL via INTRAVENOUS
  Filled 2015-11-19: qty 3

## 2015-11-19 MED ORDER — SODIUM CHLORIDE 0.9 % IV SOLN: 250.0000 mL | INTRAVENOUS | Status: DC

## 2015-11-19 MED ORDER — SODIUM CHLORIDE FLUSH 0.9 % IV SOLN
INTRAVENOUS | Status: AC
  Filled 2015-11-19: qty 10

## 2015-11-19 MED ORDER — LORAZEPAM 2 MG/ML IJ SOLN
1.0000 mg | INTRAMUSCULAR | Status: DC | PRN
  Administered 2015-11-19 – 2015-11-21 (×9): 1 mg via INTRAVENOUS
  Filled 2015-11-19 (×9): qty 1

## 2015-11-19 MED ORDER — MIDAZOLAM HCL 5 MG/5ML IJ SOLN
INTRAMUSCULAR | Status: AC | PRN
  Administered 2015-11-19: 1 mg via INTRAVENOUS

## 2015-11-19 MED ORDER — FENTANYL CITRATE (PF) 100 MCG/2ML IJ SOLN
INTRAMUSCULAR | Status: AC
  Filled 2015-11-19: qty 2

## 2015-11-19 MED ORDER — MIDAZOLAM HCL 5 MG/5ML IJ SOLN
INTRAMUSCULAR | Status: AC
  Filled 2015-11-19: qty 10

## 2015-11-19 MED ORDER — FUROSEMIDE 10 MG/ML IJ SOLN
40.0000 mg | Freq: Every day | INTRAMUSCULAR | Status: DC
  Administered 2015-11-19 – 2015-11-22 (×4): 40 mg via INTRAVENOUS
  Filled 2015-11-19 (×4): qty 4

## 2015-11-19 MED ORDER — METOPROLOL SUCCINATE ER 50 MG PO TB24
100.0000 mg | ORAL_TABLET | Freq: Every day | ORAL | Status: DC
  Administered 2015-11-19: 100 mg via ORAL
  Filled 2015-11-19: qty 2

## 2015-11-19 MED ORDER — DILTIAZEM HCL 25 MG/5ML IV SOLN
10.0000 mg | Freq: Once | INTRAVENOUS | Status: AC
Start: 2015-11-19 — End: 2015-11-19
  Administered 2015-11-19: 10 mg via INTRAVENOUS
  Filled 2015-11-19: qty 5

## 2015-11-19 MED ORDER — DILTIAZEM LOAD VIA INFUSION
10.0000 mg | Freq: Once | INTRAVENOUS | Status: AC
Start: 2015-11-19 — End: 2015-11-19
  Administered 2015-11-19: 10 mg via INTRAVENOUS
  Filled 2015-11-19: qty 10

## 2015-11-19 MED ORDER — LIDOCAINE VISCOUS 2 % MT SOLN
OROMUCOSAL | Status: AC
  Filled 2015-11-19: qty 15

## 2015-11-19 MED ORDER — SODIUM CHLORIDE 0.9% FLUSH
3.0000 mL | Freq: Two times a day (BID) | INTRAVENOUS | Status: DC
  Administered 2015-11-19 – 2015-11-22 (×5): 3 mL via INTRAVENOUS

## 2015-11-19 MED ORDER — DEXTROSE 5 % IV SOLN
5.0000 mg/h | INTRAVENOUS | Status: DC
  Administered 2015-11-19: 5 mg/h via INTRAVENOUS
  Administered 2015-11-19: 10 mg/h via INTRAVENOUS
  Filled 2015-11-19 (×4): qty 100

## 2015-11-19 MED ORDER — LORAZEPAM 2 MG/ML IJ SOLN
2.0000 mg | Freq: Once | INTRAMUSCULAR | Status: AC
  Administered 2015-11-19: 2 mg via INTRAVENOUS

## 2015-11-19 MED ORDER — BUTAMBEN-TETRACAINE-BENZOCAINE 2-2-14 % EX AERO
INHALATION_SPRAY | CUTANEOUS | Status: AC
  Filled 2015-11-19: qty 20

## 2015-11-19 MED ORDER — SPIRONOLACTONE 25 MG PO TABS
25.0000 mg | ORAL_TABLET | Freq: Every day | ORAL | Status: DC
  Administered 2015-11-19 – 2015-11-22 (×4): 25 mg via ORAL
  Filled 2015-11-19 (×4): qty 1

## 2015-11-19 MED ORDER — FENTANYL CITRATE (PF) 100 MCG/2ML IJ SOLN
INTRAMUSCULAR | Status: AC | PRN
  Administered 2015-11-19: 25 ug via INTRAVENOUS
  Administered 2015-11-19: 50 ug via INTRAVENOUS

## 2015-11-19 MED ORDER — PROPOFOL 10 MG/ML IV BOLUS
INTRAVENOUS | Status: DC | PRN
  Administered 2015-11-19: 30 mg via INTRAVENOUS

## 2015-11-19 MED ORDER — LORAZEPAM 2 MG/ML IJ SOLN
INTRAMUSCULAR | Status: AC
  Administered 2015-11-19: 2 mg via INTRAVENOUS
  Filled 2015-11-19: qty 1

## 2015-11-19 MED ORDER — MIDAZOLAM HCL 2 MG/2ML IJ SOLN
INTRAMUSCULAR | Status: AC | PRN
  Administered 2015-11-19: 2 mg via INTRAVENOUS

## 2015-11-19 NOTE — Care Management (Signed)
Patient's family has questions regarding need for cpap. It is documented in pulmonary note that there will be a need for NIVPPV- Cpap or Bipap. Unsure which device will be needed so dicussed the need for an overnight oximetry test while patient is inpatient.  Discussed need for order for overnight oximetry when respiratory status stabilizes with Dr Elisabeth PigeonVachhani.   At present, patient's nurse states that patient is having increased work of breathing and now is on bipap and there is a potential for intubation.    Patient admitted for nstemi and pci on RCA- bare metal stent. Also found to have atrial fib. Patient had cardioversion today  developed apneic episodes which were described as severe.  Patient has obstructive sleep apnea for which patient has never sought treatment.  Wife says "we have known it for years that he would stop breathing in his sleep."  Discussed with wife the need to have a sleep study scheduled.  Discussed that this is not a procedure that is done while an inpatient.  Relayed that an appointment can be made in one of the outpatient sleep clinics while patient is inpatient.  Discussed that patient could discharge with the equipment but would likely be requested to pay first month  up front while awaiting sleep study and  results.

## 2015-11-19 NOTE — Progress Notes (Signed)
*  PRELIMINARY RESULTS* Echocardiogram Echocardiogram Transesophageal has been performed.  Bradley HousekeeperJerry R Nielsen 11/19/2015, 2:57 PM

## 2015-11-19 NOTE — Progress Notes (Signed)
Called E-link family concerned with patients breathing. Called E-[facility] to camera in.

## 2015-11-19 NOTE — Progress Notes (Signed)
During cardioversion and transesophageal echocardiogram patient developed apneic episodes which were severe thus needs CPAP titration emergently otherwise patient will go back to atrial fibrillation. Findings during transesophageal echocardiogram was moderate aortic stenosis severe LV dysfunction with left ventricular ejection fraction 25-30% mild to moderate mitral regurgitation and no evidence of thrombi in the heart. Cardioversion successfully was done with 200 J and converted to normal sinus rhythm. Advise continuation of amiodarone as well as Cardizem and advise CPAP overnight as will go into A. fib is not treated. Discussed this with critical care . 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 Thus he needs to remain in sinus rhythm and sleep apnea is causing the patient to have atrial fibrillation. Time spent today 1-2 hours critical care.

## 2015-11-19 NOTE — Anesthesia Procedure Notes (Signed)
Date/Time: 11/19/2015 2:55 PM Performed by: Lenard SimmerKARENZ, Yukie Bergeron Pre-anesthesia Checklist: Patient identified, Emergency Drugs available, Patient being monitored, Suction available and Timeout performed Patient Re-evaluated:Patient Re-evaluated prior to inductionOxygen Delivery Method: Non-rebreather mask Preoxygenation: Pre-oxygenation with 100% oxygen Intubation Type: IV induction

## 2015-11-19 NOTE — Progress Notes (Signed)
Called [facility]- ABG results back. Patient requesting water but I have explained we are unable to with BiPAP. Requested me to call MD. Devra Doppalled E-LINK and nurse will notify MD.

## 2015-11-19 NOTE — Progress Notes (Signed)
SUBJECTIVE: Patient is very agitated all night with a heart rate in the 140s to 160 range   Filed Vitals:   11/19/15 0400 11/19/15 0500 11/19/15 0600 11/19/15 0700  BP: 121/99 142/116 162/146 146/85  Pulse: 55 31 32 79  Temp: 98.7 F (37.1 C)   98.3 F (36.8 C)  TempSrc: Oral     Resp: 28 28 29 26   Height:      Weight:      SpO2: 98% 97% 97% 96%    Intake/Output Summary (Last 24 hours) at 11/19/15 0906 Last data filed at 11/19/15 0700  Gross per 24 hour  Intake  948.6 ml  Output   1850 ml  Net -901.4 ml    LABS: Basic Metabolic Panel:  Recent Labs  82/95/6205/25/17 0950 11/19/15 0453  NA 140 139  K 3.8 3.7  CL 107 103  CO2 24 28  GLUCOSE 126* 120*  BUN 21* 20  CREATININE 1.30* 1.35*  CALCIUM 8.9 8.0*   Liver Function Tests: No results for input(s): AST, ALT, ALKPHOS, BILITOT, PROT, ALBUMIN in the last 72 hours. No results for input(s): LIPASE, AMYLASE in the last 72 hours. CBC:  Recent Labs  11/18/15 0950 11/19/15 0453  WBC 16.9* 18.1*  NEUTROABS 14.0*  --   HGB 14.6 13.7  HCT 43.4 41.1  MCV 85.3 87.5  PLT 185 159   Cardiac Enzymes:  Recent Labs  11/18/15 0950 11/18/15 1719 11/18/15 2116  TROPONINI 3.35* 4.57* 5.25*   BNP: Invalid input(s): POCBNP D-Dimer: No results for input(s): DDIMER in the last 72 hours. Hemoglobin A1C: No results for input(s): HGBA1C in the last 72 hours. Fasting Lipid Panel:  Recent Labs  11/18/15 1719  CHOL 174  HDL 33*  LDLCALC 116*  TRIG 127  CHOLHDL 5.3   Thyroid Function Tests:  Recent Labs  11/18/15 1719  TSH 1.458   Anemia Panel: No results for input(s): VITAMINB12, FOLATE, FERRITIN, TIBC, IRON, RETICCTPCT in the last 72 hours.   PHYSICAL EXAM General: Well developed, well nourished, in no acute distress HEENT:  Normocephalic and atramatic Neck:  No JVD.  Lungs: Clear bilaterally to auscultation and percussion. Heart: HRRR . Normal S1 and S2 without gallops or murmurs.  Abdomen: Bowel sounds are  positive, abdomen soft and non-tender  Msk:  Back normal, normal gait. Normal strength and tone for age. Extremities: No clubbing, cyanosis or edema.   Neuro: Alert and oriented X 3. Psych:  Good affect, responds appropriately  TELEMETRY:A. fib with rapid ventricular response rate  ASSESSMENT AND PLAN: Status post PCI with drug-eluting stent of the mid RCA status post non-STEMI. Echocardiogram shows four-chamber dilatation with severely dilated right atrium and left atrium and severe  mitral annular calcification and severe aortic stenosis. Patient also appears to have sleep apnea. Left ventricle ejection fraction was only 25% on echocardiogram. Consideration of electrical cardioversion was done yesterday however it was felt that he'll go back into A. fib because of four-chamber dilated patient and possible sleep apnea. But we will do one attempt at doing cardioversion today. [facility]'t [facility]. Also advise adding Cardizem to the current regimen. Principal Problem:   NSTEMI (non-ST elevated myocardial infarction) (HCC)    KHAN,SHAUKAT A, MD, [facility] 11/19/2015 9:06 AM

## 2015-11-19 NOTE — Progress Notes (Signed)
eLink Physician-Brief Progress Note Patient Name: Sherlyn HayKenneth M Wernli DOB: 24-Nov-1955 MRN: [id number]   Date of Service  11/19/2015  HPI/Events of Note  ABG, CXR reviewed 7.39/48/86/96% Mild edema on CXR Pt looks more comfortable on Bipap  eICU Interventions  Will continue Bipap at current settings     Intervention Category Evaluation Type: Other  Ryleeann Urquiza 11/19/2015, 5:35 PM

## 2015-11-19 NOTE — Progress Notes (Addendum)
Pt increasingly anxious wanting to go home through out night. At 0320 pt Increased anxiety, increased HR, and pulling at lines.  MD informed, new orders given for 2mg  ativan IV push.  Pt HR decreased, less anxious, and resting in bed after ativan given.  Dr.Pyready called at 0600 due to pt increasing B/P and HR new orders given for 10mg  Cardizem.  Pt wife called at 0630 per pt request to return to his bedside.

## 2015-11-19 NOTE — Consult Note (Addendum)
PULMONARY / CRITICAL CARE MEDICINE   Name: Bradley Nielsen MRN: [id number] DOB: Mar 12, 1956    ADMISSION DATE:  11/18/2015 CONSULTATION DATE:  11/19/15  REFERRING MD :  Dr. Welton Flakes (Cardiology)   CHIEF COMPLAINT:    SOB/AFIB Reason for Consult - OSA needing CPAP initiation    HISTORY OF PRESENT ILLNESS   60 year old male past medical history of chronic low back pain, status post spinal fusion, now with new onset atrial fibrillation, obesity, seen in consultation per request of cardiology, Dr. Welton Flakes, for CPAP initiation. Strip her patient, wife, chart review. For the past the patient has been having shortness of breath, chest tightness, sweating, feeling very weak, difficulty sleeping at night and laying flat in bed. He presented to urgent care yesterday that showed he was having heart rates in the 200s, they gave a bolus of amiodarone and sent him to the emergency for further workup.  Upon arrival to the ED he was still noted to be in A. fib with RVR, had elevated troponins, still with profuse sweating and shortness of breath. He was taken emergently to the Cath Lab where he was found to have a high-grade lesion in the mid RCA, status post bare-metal stenting and started on medical therapy with aspirin, Plavix, Coumadin. However these continued in A. fib with RVR and still having shortness of breath with diaphoresis, he is being taken back to the Cath Lab today for electrical cardioversion via TEE. Cardiology stated that patient's A. fib is refractory secondary to obstructive sleep apnea (untreated/undiagnosed), that could be leading to catecholamine surge and worsening A. Fib.  Patient wife  states the patient has significant apneic events at night, he has not been sleeping for the past week, he does have a thick neck.  Obstructive Sleep Apnea Screening The patient was screened with the STOP-BANG questionnaire. >3 positive responses is considered a positive  screen  Snoring  YES Tiredness  YES Observed Apnea YES Pressure (HTN) NO BMI >35  YES Age > 50  YES Neck >17"  YES Gender (male) YES  Total: 7/8   Screen: POSITIVE      SIGNIFICANT EVENTS  5/25>sob/diaphroesis x 1 week, found to have a fib with RVT and elevated troponin, cath with high RCA lesion s/p baremetal stent,  5/26> continued afib with RVR, clinically with severe sleep apnea (no formal sleep study), started on CPAP  STUDIES: 5/25 ECHO > Four-chamber dilated patient with severe LV dysfunction with left ventricular ejection fraction 25-30%. There is also severe aortic stenosis with fibrocalcific aortic valve. There is also mitral annular calcification and mild mitral regurgitation. 5/25 Cardiac Cath>High grade lesion mid RCA with Baremetal stenting, advise asp/plavix/coumadin.  PAST MEDICAL HISTORY    :  Past Medical History  Diagnosis Date  . Chronic pain due to injury    Past Surgical History  Procedure Laterality Date  . Total knee arthroplasty    . Back surgery    . Cardiac catheterization Right 11/18/2015    Procedure: Left Heart Cath and Coronary Angiography;  Surgeon: Laurier Nancy, MD;  Location: [facility] INVASIVE CV LAB;  Service: Cardiovascular;  Laterality: Right;  . Cardiac catheterization N/A 11/18/2015    Procedure: Coronary Stent Intervention;  Surgeon: Alwyn Pea, MD;  Location: [facility] INVASIVE CV LAB;  Service: Cardiovascular;  Laterality: N/A;   Prior to Admission medications   Medication Sig Start Date End Date Taking? Authorizing Provider  diclofenac (VOLTAREN) 75 MG EC tablet Take 75 mg by mouth 2 (two) times daily.  Yes Historical Provider, MD  gabapentin (NEURONTIN) 300 MG capsule Take 300 mg by mouth 3 (three) times daily.   Yes Historical Provider, MD  HYDROcodone-acetaminophen (NORCO) 10-325 MG tablet Take 1 tablet by mouth every 6 (six) hours as needed.   Yes Historical Provider, MD  zolpidem (AMBIEN) 10 MG tablet Take 10 mg by mouth at  bedtime as needed for sleep.   Yes Historical Provider, MD   No Known Allergies   FAMILY HISTORY   Family History  Problem Relation Age of Onset  . Throat cancer Father       SOCIAL HISTORY    reports that he has never smoked. He does not have any smokeless tobacco history on file. He reports that he does not drink alcohol or use illicit drugs.  Review of Systems  Constitutional: Positive for malaise/fatigue and diaphoresis. Negative for fever, chills and weight loss.  HENT: Negative for hearing loss.   Eyes: Negative for blurred vision, double vision and photophobia.  Respiratory: Positive for shortness of breath. Negative for cough, hemoptysis, sputum production and wheezing.   Cardiovascular: Positive for chest pain, palpitations, orthopnea and PND. Negative for leg swelling.  Gastrointestinal: Negative for heartburn, nausea, vomiting and abdominal pain.  Genitourinary: Negative for dysuria.  Musculoskeletal: Positive for back pain. Negative for myalgias.       Chronic lower back pain  Skin: Negative for itching and rash.  Neurological: Positive for weakness. Negative for dizziness and headaches.  Endo/Heme/Allergies: Does not bruise/bleed easily.  Psychiatric/Behavioral: Negative for depression.      VITAL SIGNS    Temp:  [97.8 F (36.6 C)-98.7 F (37.1 C)] 98.3 F (36.8 C) (05/26 0700) Pulse Rate:  [31-125] 73 (05/26 1100) Resp:  [0-31] 30 (05/26 1100) BP: (87-162)/(56-146) 127/75 mmHg (05/26 1100) SpO2:  [90 %-99 %] 96 % (05/26 1100) Weight:  [300 lb (136.079 kg)] 300 lb (136.079 kg) (05/25 1755) HEMODYNAMICS:   VENTILATOR SETTINGS:   INTAKE / OUTPUT:  Intake/Output Summary (Last 24 hours) at 11/19/15 1315 Last data filed at 11/19/15 0700  Gross per 24 hour  Intake  948.6 ml  Output   1850 ml  Net -901.4 ml       PHYSICAL EXAM   Physical Exam  Constitutional: He is oriented to person, place, and time. He appears well-developed and  well-nourished.  HENT:  Head: Normocephalic and atraumatic.  Right Ear: External ear normal.  Left Ear: External ear normal.  Eyes: Conjunctivae and EOM are normal. Pupils are equal, round, and reactive to light.  Neck: Normal range of motion. Neck supple.  Mallampati - 4  Cardiovascular:  Tachycardia, irregular  Pulmonary/Chest: No respiratory distress. He has no wheezes. He has no rales. He exhibits no tenderness.  Abdominal: Soft. Bowel sounds are normal. He exhibits no distension. There is no tenderness. There is no rebound.  Abdominal obesity  Musculoskeletal: Normal range of motion. He exhibits no edema.  Neurological: He is alert and oriented to person, place, and time.  Skin:  diaphroetic  Nursing note and vitals reviewed.      LABS   LABS:  CBC  Recent Labs Lab 11/18/15 0950 11/19/15 0453  WBC 16.9* 18.1*  HGB 14.6 13.7  HCT 43.4 41.1  PLT 185 159   Coag's  Recent Labs Lab 11/18/15 0950  APTT 32  INR 1.19   BMET  Recent Labs Lab 11/18/15 0950 11/19/15 0453  NA 140 139  K 3.8 3.7  CL 107 103  CO2 24 28  BUN 21* 20  CREATININE 1.30* 1.35*  GLUCOSE 126* 120*   Electrolytes  Recent Labs Lab 11/18/15 0950 11/19/15 0453  CALCIUM 8.9 8.0*   Sepsis Markers No results for input(s): LATICACIDVEN, PROCALCITON, O2SATVEN in the last 168 hours. ABG No results for input(s): PHART, PCO2ART, PO2ART in the last 168 hours. Liver Enzymes No results for input(s): AST, ALT, ALKPHOS, BILITOT, ALBUMIN in the last 168 hours. Cardiac Enzymes  Recent Labs Lab 11/18/15 0950 11/18/15 1719 11/18/15 2116  TROPONINI 3.35* 4.57* 5.25*   Glucose  Recent Labs Lab 11/18/15 1752  GLUCAP 125*     Recent Results (from the past 240 hour(s))  MRSA PCR Screening     Status: None   Collection Time: 11/18/15  6:00 PM  Result Value Ref Range Status   MRSA by PCR NEGATIVE NEGATIVE Final    Comment:        The GeneXpert MRSA Assay (FDA approved for NASAL  specimens only), is one component of a comprehensive MRSA colonization surveillance program. It is not intended to diagnose MRSA infection nor to guide or monitor treatment for MRSA infections.      Current facility-administered medications:  .  0.9 %  sodium chloride infusion, , Intravenous, Continuous, Altamese DillingVaibhavkumar Vachhani, MD, Last Rate: 125 mL/hr at 11/18/15 1311 .  0.9 %  sodium chloride infusion, 250 mL, Intravenous, PRN, Dwayne D Callwood, MD .  Melene Muller[START ON 11/20/2015] 0.9 %  sodium chloride infusion, , Intravenous, Continuous, Laurier NancyShaukat A Khan, MD .  0.9 %  sodium chloride infusion, 250 mL, Intravenous, Continuous, Laurier NancyShaukat A Khan, MD, 250 mL at 11/19/15 0915 .  acetaminophen (TYLENOL) tablet 650 mg, 650 mg, Oral, Q4H PRN, Dwayne D Callwood, MD .  amiodarone (NEXTERONE PREMIX) 360-4.14 MG/200ML-% (1.8 mg/mL) IV infusion, 60 mg/hr, Intravenous, Continuous, Altamese DillingVaibhavkumar Vachhani, MD, Last Rate: 33.3 mL/hr at 11/19/15 0045, 60 mg/hr at 11/19/15 0045 .  antiseptic oral rinse (CPC / CETYLPYRIDINIUM CHLORIDE 0.05%) solution 7 mL, 7 mL, Mouth Rinse, BID, Laurier NancyShaukat A Khan, MD, 7 mL at 11/19/15 1000 .  aspirin chewable tablet 81 mg, 81 mg, Oral, Daily, Dwayne D Callwood, MD, 81 mg at 11/19/15 0828 .  butamben-tetracaine-benzocaine (CETACAINE) 07-28-12 % spray, , , ,  .  [COMPLETED] diltiazem (CARDIZEM) 1 mg/mL load via infusion 10 mg, 10 mg, Intravenous, Once, 10 mg at 11/19/15 0915 **AND** diltiazem (CARDIZEM) 100 mg in dextrose 5 % 100 mL (1 mg/mL) infusion, 5-15 mg/hr, Intravenous, Continuous, Laurier NancyShaukat A Khan, MD, Last Rate: 10 mL/hr at 11/19/15 1015, 10 mg/hr at 11/19/15 1015 .  gabapentin (NEURONTIN) capsule 300 mg, 300 mg, Oral, TID, Altamese DillingVaibhavkumar Vachhani, MD, 300 mg at 11/19/15 0828 .  HYDROcodone-acetaminophen (NORCO) 10-325 MG per tablet 1 tablet, 1 tablet, Oral, Q6H PRN, Altamese DillingVaibhavkumar Vachhani, MD, 1 tablet at 11/18/15 2120 .  lidocaine (XYLOCAINE) 2 % viscous mouth solution, , , ,  .   lidocaine (XYLOCAINE) 2 % viscous mouth solution, , , ,  .  LORazepam (ATIVAN) injection 1 mg, 1 mg, Intravenous, Q4H PRN, Altamese DillingVaibhavkumar Vachhani, MD, 1 mg at 11/19/15 0828 .  LORazepam (ATIVAN) injection 2 mg, 2 mg, Intravenous, Once, Altamese DillingVaibhavkumar Vachhani, MD, 2 mg at 11/18/15 1725 .  ondansetron (ZOFRAN) injection 4 mg, 4 mg, Intravenous, Q6H PRN, Dwayne D Callwood, MD .  sodium chloride flush (NS) 0.9 % injection 3 mL, 3 mL, Intravenous, Q12H, Altamese DillingVaibhavkumar Vachhani, MD, 3 mL at 11/18/15 2200 .  sodium chloride flush (NS) 0.9 % injection 3 mL, 3 mL, Intravenous, Q12H, Dwayne D  Callwood, MD, 3 mL at 11/18/15 1915 .  sodium chloride flush (NS) 0.9 % injection 3 mL, 3 mL, Intravenous, PRN, Dwayne D Callwood, MD .  sodium chloride flush (NS) 0.9 % injection 3 mL, 3 mL, Intravenous, Q12H, Laurier Nancy, MD, 3 mL at 11/19/15 1000 .  sodium chloride flush (NS) 0.9 % injection 3 mL, 3 mL, Intravenous, PRN, Laurier Nancy, MD .  sodium chloride flush 0.9 % injection, , , ,  .  ticagrelor (BRILINTA) tablet 90 mg, 90 mg, Oral, BID, Dwayne D Callwood, MD, 90 mg at 11/19/15 0828 .  zolpidem (AMBIEN) tablet 10 mg, 10 mg, Oral, QHS PRN, Altamese Dilling, MD, 10 mg at 11/18/15 2125  IMAGING    No results found.    Indwelling Urinary Catheter continued, requirement due to   Reason to continue Indwelling Urinary Catheter for strict Intake/Output monitoring for hemodynamic instability   Central Line continued, requirement due to   Reason to continue [employer] Monitoring of central venous pressure or other hemodynamic parameters   Ventilator continued, requirement due to, resp failure    Ventilator Sedation RASS 0 to -2   Cultures: BCx2  UC  Sputum  Antibiotics:  Lines:   ASSESSMENT/PLAN  60 year old male past medical history of obesity, chronic back pain, now with new onset atrial fibrillation, NSTEMI status post cardiac catheterization with bare metal stenting to the RCA, with  refractory afib, EF ~25%, 4 chamber dilation, seen in consultation for OSA evaluation.   A: NSTEMI New onset atrial fibrillation Dyspnea Elevated troponin Sleep disturbance-suspected obstructive sleep apnea  P: - Patient is now status post cardiac catheterization with bare-metal stenting to the RCA due to a high-grade RCA lesion, medical management with aspirin, Plavix, Coumadin. -scheduled for cardioversion today for refractory afib - cont amiodarone  - Patient still with refractory atrial fibrillation after amio infusion, possibly secondary to severe sleep apnea and catecholamine surge, patient with high-risk factors for sleep apnea and will require NIVPPV (CPAP or BiPAP). -Start AutoPap (10-20 cm H2O) at night and with naps - patient will require follow up with Dr. Yves Dill for outpatient OSA sleep study and workup.  - cont with medical management for ACS  Thank you for consulting [facility].  Please feel free to contact us with any questions at [phone] (please enter 7-digits).  Pulmonary consult time - 40 mins  Stephanie Acre, MD [facility] Pager [phone] (please enter 7-digits) On Call Pager - [phone] (please enter 7-digits)     11/19/2015, 1:15 PM  Note: This note was prepared with Dragon dictation along with smaller phrase technology. Any transcriptional errors that result from this process are unintentional.

## 2015-11-19 NOTE — Progress Notes (Signed)
Patient ordered for cardioversion despite amiodarone and Cardizem drip. Patient returned with labored breathing and placed on CPAP. Family has been concerned due to minimal improvement on CPAP. Family concerned that he appears worse after cardioversion and not better. Elink called and ABG, BIPAP and chest xray ordered. Patient already given lasix. Continue to monitor and await results/orders.

## 2015-11-19 NOTE — Anesthesia Preprocedure Evaluation (Addendum)
Anesthesia Evaluation  Patient identified by MRN, date of birth, ID band Patient awake    Reviewed: Allergy & Precautions, H&P , NPO status , Patient's Chart, lab work & pertinent test results, reviewed documented beta blocker date and time   History of Anesthesia Complications Negative for: history of anesthetic complications  Airway Mallampati: III  TM Distance: >3 FB Neck ROM: full    Dental no notable dental hx. (+) Teeth Intact   Pulmonary shortness of breath, sleep apnea (likely based on history) , neg COPD, neg recent URI,           Cardiovascular Exercise Tolerance: Good + CAD, + Past MI and + Cardiac Stents  (-) CABG Atrial Fibrillation + Valvular Problems/Murmurs AS  Rhythm:irregular Rate:Abnormal  Pt is s/p DES placement yesterday, now with EF of 25%, severe AS, and severe mitral annular calcification, and severely dilated right and left atria.   Neuro/Psych negative neurological ROS  negative psych ROS   GI/Hepatic Neg liver ROS, GERD  ,  Endo/Other  neg diabetesMorbid obesity  Renal/GU negative Renal ROS  negative genitourinary   Musculoskeletal   Abdominal   Peds  Hematology negative hematology ROS (+)   Anesthesia Other Findings Past Medical History:   Chronic pain due to injury                                   Reproductive/Obstetrics negative OB ROS                            Anesthesia Physical Anesthesia Plan  ASA: IV  Anesthesia Plan: General   Post-op Pain Management:    Induction:   Airway Management Planned:   Additional Equipment:   Intra-op Plan:   Post-operative Plan:   Informed Consent: I have reviewed the patients History and Physical, chart, labs and discussed the procedure including the risks, benefits and alternatives for the proposed anesthesia with the patient or authorized representative who has indicated his/her understanding and acceptance.    Dental Advisory Given  Plan Discussed with: Anesthesiologist, CRNA and Surgeon  Anesthesia Plan Comments:        Anesthesia Quick Evaluation

## 2015-11-19 NOTE — Progress Notes (Signed)
  NAME:  Bradley Nielsen   MRN: [id number] DOB:  03/14/1956   ADMIT DATE: 11/18/2015  Procedure: Electrical Cardioversion Indications:  Atrial Fibrillation  Procedure Details:    Time Out: Verified patient identification, verified procedure, site/side was marked, verified correct patient position, special equipment/implants available, medications/allergies/relevent history reviewed, required imaging and test results available.    Patient placed on cardiac monitor, pulse oximetry, supplemental oxygen as necessary.  Sedation given:  Pacer pads placed   Cardioverted . Patient was cardioverted to normal sinus rhythm with 200 J Cardioverted at 200 J  Evaluation: Findings: Post procedure EKG shows: Normal sinus rhythm Complications: None Patient did well.     Laurier NancyKHAN,Eual Lindstrom A, M.D. [facility]   11/19/2015 3:13 PM

## 2015-11-19 NOTE — Progress Notes (Signed)
Dr Welton FlakesKhan came by unit to evaluate patient heart rate up into 160's. Patient has been very anxious. MD will evaluate patient. No new orders at this time.

## 2015-11-19 NOTE — Transfer of Care (Signed)
Immediate Anesthesia Transfer of Care Note  Patient: Bradley Nielsen  Procedure(s) Performed: Procedure(s): CARDIOVERSION (N/A) TRANSESOPHAGEAL ECHOCARDIOGRAM (TEE) (N/A)  Patient Location: PACU and Cath Lab  Anesthesia Type:General  Level of Consciousness: patient cooperative and lethargic  Airway & Oxygen Therapy: Patient Spontanous Breathing and Patient connected to nasal cannula oxygen  Post-op Assessment: Report given to RN and Post -op Vital signs reviewed and stable  Post vital signs: Reviewed and stable  Last Vitals:  Filed Vitals:   11/19/15 1508 11/19/15 1509  BP:  140/93  Pulse: 102 101  Temp:    Resp: 27 30    Last Pain:  Filed Vitals:   11/19/15 1506  PainSc: 3       Patients Stated Pain Goal: 5 (11/18/15 1300)  Complications: No apparent anesthesia complications

## 2015-11-19 NOTE — Progress Notes (Signed)
Per Dr Esaw GrandchildVachanni ordered ativan for patients anxiety.

## 2015-11-19 NOTE — Progress Notes (Signed)
eLink Physician-Brief Progress Note Patient Name: Bradley HayKenneth M Ferrentino DOB: [birth year] MRN: [id number]   Date of Service  11/19/2015  HPI/Events of Note  Pt with increased WOB. CPAP started for treatment of OSA, apnea. Underwent cardioversion of Afib today. Pt is admitted for NSTEMI s/p stent of RCA.   eICU Interventions  Check CXR and ABG. Change CPAP to Bipap.      Intervention Category Intermediate Interventions: Respiratory distress - evaluation and management  Ephriam Turman 11/19/2015, 4:39 PM

## 2015-11-19 NOTE — Progress Notes (Signed)
Spoke with Dr. Isaiah SergeMannam regarding patients blood pressure and high diastolic. At this point no new orders and continue to monitor.

## 2015-11-20 DIAGNOSIS — I480 Paroxysmal atrial fibrillation: Secondary | ICD-10-CM

## 2015-11-20 MED ORDER — WARFARIN SODIUM 5 MG PO TABS
5.0000 mg | ORAL_TABLET | Freq: Once | ORAL | Status: AC
  Administered 2015-11-20: 5 mg via ORAL
  Filled 2015-11-20: qty 1

## 2015-11-20 MED ORDER — HEPARIN SODIUM (PORCINE) 5000 UNIT/ML IJ SOLN
5000.0000 [IU] | Freq: Three times a day (TID) | INTRAMUSCULAR | Status: DC
  Administered 2015-11-20: 5000 [IU] via SUBCUTANEOUS
  Filled 2015-11-20: qty 1

## 2015-11-20 MED ORDER — WARFARIN SODIUM 1 MG PO TABS: 10.0000 mg | ORAL_TABLET | Freq: Once | ORAL | Status: DC

## 2015-11-20 MED ORDER — METOPROLOL SUCCINATE ER 100 MG PO TB24
200.0000 mg | ORAL_TABLET | Freq: Every day | ORAL | Status: DC
  Administered 2015-11-20 – 2015-11-22 (×3): 200 mg via ORAL
  Filled 2015-11-20: qty 4
  Filled 2015-11-20 (×2): qty 2

## 2015-11-20 MED ORDER — AMIODARONE HCL 200 MG PO TABS
800.0000 mg | ORAL_TABLET | Freq: Once | ORAL | Status: AC
  Administered 2015-11-20: 800 mg via ORAL
  Filled 2015-11-20: qty 4

## 2015-11-20 MED ORDER — WARFARIN - PHARMACIST DOSING INPATIENT: Freq: Every day | Status: DC

## 2015-11-20 MED ORDER — HEPARIN SODIUM (PORCINE) 5000 UNIT/ML IJ SOLN: 5000.0000 [IU] | Freq: Three times a day (TID) | INTRAMUSCULAR | Status: DC

## 2015-11-20 NOTE — Progress Notes (Signed)
PULMONARY / CRITICAL CARE MEDICINE   Name: Bradley Nielsen MRN: [id number] DOB: 28-Dec-1955    ADMISSION DATE:  11/18/2015  CHIEF COMPLAINT: SOB/AFIB; OSA requiring CPAP initiation  HISTORY OF PRESENT ILLNESS:   60 year old male past medical history of chronic low back pain, status post spinal fusion, now with new onset atrial fibrillation, obesity, seen in consultation per request of cardiology, Dr. Welton FlakesKhan, for CPAP initiation. Strip her patient, wife, chart review. For the past the patient has been having shortness of breath, chest tightness, sweating, feeling very weak, difficulty sleeping at night and laying flat in bed. He presented to urgent care yesterday that showed he was having heart rates in the 200s, they gave a bolus of amiodarone and sent him to the emergency for further workup. Upon arrival to the ED he was still noted to be in A. fib with RVR, had elevated troponins, still with profuse sweating and shortness of breath. He was taken emergently to the Cath Lab where he was found to have a high-grade lesion in the mid RCA, status post bare-metal stenting and started on medical therapy with aspirin, Plavix, Coumadin. However these continued in A. fib with RVR and still having shortness of breath with diaphoresis, he is being taken back to the Cath Lab today for electrical cardioversion via TEE. Cardiology stated that patient's A. fib is refractory secondary to obstructive sleep apnea (untreated/undiagnosed), that could be leading to catecholamine surge and worsening A.   SUBJECTIVE:  No acute issues overnight. Maintain on BiPAP overnight.    VITAL SIGNS: BP 122/89 mmHg  Pulse 73  Temp(Src) 98.8 F (37.1 C) (Axillary)  Resp 21  Ht 5\' 8"  (1.727 m)  Wt 300 lb (136.079 kg)  BMI 45.63 kg/m2  SpO2 97%  HEMODYNAMICS:    VENTILATOR SETTINGS:    INTAKE / OUTPUT: I/O last 3 completed shifts: In: 1768.2 [P.O.:200; I.V.:1568.2] Out: 2415 [Urine:2415]  PHYSICAL  EXAMINATION: General: NAD HEENT:  PERRLA, trachea midline Neuro:AAO X4; no focal deficits Cardiovascular: RRR, S1/S2, no MRG Lungs: CTAB Abdomen: Normal bowel sounds Musculoskeletal:  +rom Extremities: +2 pules Skin: Warm and dry  LABS:  BMET  Recent Labs Lab 11/18/15 0950 11/19/15 0453  NA 140 139  K 3.8 3.7  CL 107 103  CO2 24 28  BUN 21* 20  CREATININE 1.30* 1.35*  GLUCOSE 126* 120*    Electrolytes  Recent Labs Lab 11/18/15 0950 11/19/15 0453  CALCIUM 8.9 8.0*    CBC  Recent Labs Lab 11/18/15 0950 11/19/15 0453  WBC 16.9* 18.1*  HGB 14.6 13.7  HCT 43.4 41.1  PLT 185 159    Coag's  Recent Labs Lab 11/18/15 0950  APTT 32  INR 1.19    Sepsis Markers No results for input(s): LATICACIDVEN, PROCALCITON, O2SATVEN in the last 168 hours.  ABG  Recent Labs Lab 11/19/15 1710  PHART 7.39  PCO2ART 48  PO2ART 86    Liver Enzymes No results for input(s): AST, ALT, ALKPHOS, BILITOT, ALBUMIN in the last 168 hours.  Cardiac Enzymes  Recent Labs Lab 11/18/15 0950 11/18/15 1719 11/18/15 2116  TROPONINI 3.35* 4.57* 5.25*    Glucose  Recent Labs Lab 11/18/15 1752  GLUCAP 125*    Imaging Dg Chest Port 1 View  11/19/2015  CLINICAL DATA:  Acute respiratory failure. He was in AFIB yesterday at a urgent care with SOB. Hx of cardiac cath. Non smoker. EXAM: PORTABLE CHEST 1 VIEW COMPARISON:  11/30/1999 FINDINGS: Heart is enlarged. There is pulmonary vascular congestion and  probable interstitial edema. No focal consolidations. Possible small effusions. IMPRESSION: Cardiomegaly and mild edema.  Possible bilateral pleural effusions. Electronically Signed   By: Norva Pavlov M.D.   On: 11/19/2015 17:12     STUDIES:  S/p TEE and cardioversion-LVEF 25-30%-see report  CULTURES: None  ANTIBIOTICS: None  SIGNIFICANT EVENTS: 05/25>ADMITTED with new onset afib>cardioverted>icu for BiPAP  LINES/TUBES: pivs  DISCUSSION: 60 year old male  past medical history of obesity, chronic back pain, now with new onset atrial fibrillation, NSTEMI status post cardiac catheterization with bare metal stenting to the RCA, with refractory afib, EF ~25%, 4 chamber dilation, seen in consultation for OSA evaluation.   ASSESSMENT / PLAN:  PULMONARY A: Acute respiratory failure requiring BiPAP Dyspnea Suspected OSA/OHS P:   -BiPAP at HS and prn -Will need follow-up with Dr. Yves Dill for outpatient OSA sleep study and workup upon discharge  CARDIOVASCULAR A:  CAD S/Pcardiac catheterization with bare-metal stenting to the RCA due to a high-grade RCA lesion New onset Afib s/p cardioversion-remains in NSR  P:  -Continue Amio and diltiazem for rate control -Continue Brilinta and ASA -Cardiology following -Hemodynamics per ICU  RENAL A:   AKI P:   -Trend creatinine -Monitor Is/Os -Renally dose meds  INFECTIOUS A:   Leukocytosis-likely 2/2 cardioversion P:   -Monitor CBC -Culture if febrile  NEUROLOGIC A:   Anxiety P:   -prn lorazepam   Disposition and family update: No family at bedside. Patient updated on current treatment plan  Best Practice: Code Status:  Full. Diet: Heart Healthy / Carb Mod. GI prophylaxis: not indicated VTE prophylaxis:  SCD's / heparin.  CCM time is 35 minutes  Magdalene S. [facility] ANP-BC Pulmonary and Critical Care Medicine [facility] Pager [phone] or [phone]    11/20/2015, 7:01 AM

## 2015-11-20 NOTE — Progress Notes (Signed)
SUBJECTIVE: Patient denies any chest pain but still very short of breath and wore his BiPAP last night   Filed Vitals:   11/20/15 0700 11/20/15 0745 11/20/15 0800 11/20/15 0900  BP: 112/82  128/87 121/77  Pulse: 74  81 81  Temp:      TempSrc:      Resp: 23  25 28   Height:      Weight:      SpO2: 98% 97% 94% 95%    Intake/Output Summary (Last 24 hours) at 11/20/15 1022 Last data filed at 11/20/15 0959  Gross per 24 hour  Intake 1146.2 ml  Output   1665 ml  Net -518.8 ml    LABS: Basic Metabolic Panel:  Recent Labs  78/29/5605/25/17 0950 11/19/15 0453  NA 140 139  K 3.8 3.7  CL 107 103  CO2 24 28  GLUCOSE 126* 120*  BUN 21* 20  CREATININE 1.30* 1.35*  CALCIUM 8.9 8.0*   Liver Function Tests: No results for input(s): AST, ALT, ALKPHOS, BILITOT, PROT, ALBUMIN in the last 72 hours. No results for input(s): LIPASE, AMYLASE in the last 72 hours. CBC:  Recent Labs  11/18/15 0950 11/19/15 0453  WBC 16.9* 18.1*  NEUTROABS 14.0*  --   HGB 14.6 13.7  HCT 43.4 41.1  MCV 85.3 87.5  PLT 185 159   Cardiac Enzymes:  Recent Labs  11/18/15 0950 11/18/15 1719 11/18/15 2116  TROPONINI 3.35* 4.57* 5.25*   BNP: Invalid input(s): POCBNP D-Dimer: No results for input(s): DDIMER in the last 72 hours. Hemoglobin A1C: No results for input(s): HGBA1C in the last 72 hours. Fasting Lipid Panel:  Recent Labs  11/18/15 1719  CHOL 174  HDL 33*  LDLCALC 116*  TRIG 127  CHOLHDL 5.3   Thyroid Function Tests:  Recent Labs  11/18/15 1719  TSH 1.458   Anemia Panel: No results for input(s): VITAMINB12, FOLATE, FERRITIN, TIBC, IRON, RETICCTPCT in the last 72 hours.   PHYSICAL EXAM General: Well developed, well nourished, in no acute distress HEENT:  Normocephalic and atramatic Neck:  No JVD.  Lungs: Clear bilaterally to auscultation and percussion. Heart: HRRR . Normal S1 and S2 without gallops or murmurs.  Abdomen: Bowel sounds are positive, abdomen soft and  non-tender  Msk:  Back normal, normal gait. Normal strength and tone for age. Extremities: No clubbing, cyanosis or edema.   Neuro: Alert and oriented X 3. Psych:  Good affect, responds appropriately  TELEMETRY:Remains in sinus rhythm about 80 bpm  ASSESSMENT AND PLAN: Non-STEMI and severe LV dysfunction with left ventricle ejection fraction 25-30% and severe sleep apnea noted during cardioversion. After cardioversion has remained in sinus rhythm and was able to use BiPAP last night. We will switch amiodarone to 800 mg a day and t then decrease the dosage gradually also will add metoprolol 200 mg by mouth once a day and add Coumadin as has atrial fibrillation. Patient will go back to atrial fibrillation if patient is not treated with BiPAP and not sent home on BiPAP thus needs to be arranged by social worker or respiratory therapist or the hospitalist. I will arrange for sleep study in my office upon discharge. We will DC the Cardizem and amiodarone IV and may go to telemetry.  Principal Problem:   NSTEMI (non-ST elevated myocardial infarction) (HCC)    Bradley BlackwaterKHAN,SHAUKAT A, MD, [facility] 11/20/2015 10:22 AM

## 2015-11-20 NOTE — Anesthesia Postprocedure Evaluation (Signed)
Anesthesia Post Note  Patient: Bradley Nielsen  Procedure(s) Performed: Procedure(s) (LRB): CARDIOVERSION (N/A) TRANSESOPHAGEAL ECHOCARDIOGRAM (TEE) (N/A)  Patient location during evaluation: ICU Anesthesia Type: General Level of consciousness: awake and alert Pain management: pain level controlled Vital Signs Assessment: post-procedure vital signs reviewed and stable Respiratory status: spontaneous breathing, nonlabored ventilation, respiratory function stable and patient connected to nasal cannula oxygen Cardiovascular status: blood pressure returned to baseline and stable Postop Assessment: no signs of nausea or vomiting Anesthetic complications: no    Last Vitals:  Filed Vitals:   11/20/15 0700 11/20/15 0800  BP: 112/82 128/87  Pulse: 74 81  Temp:    Resp: 23 25    Last Pain:  Filed Vitals:   11/20/15 0830  PainSc: 0-No pain                 Cleda MccreedyJoseph K Jerriah Ines

## 2015-11-20 NOTE — Progress Notes (Signed)
Patient resting intermittently overnight, remains on bipap 30%, tolerated mask off up to 5 min maintaining saturation. No complaints of pain. Tolerated sips and ice chips. See CH[facility] for further update

## 2015-11-20 NOTE — Progress Notes (Signed)
Pt accepted in transfer from [facility] to rm 248. He is a/o w/o c/o chest pain. Rt groin site is soft with small amt eccymoses. strong bilat pedal pulses. Humidified 02 on. Pt having some anxiety. Med per request with 1 mg ativan. Family at bedside.

## 2015-11-20 NOTE — Progress Notes (Signed)
[employer] [facility] - [place] at [facility]   PATIENT NAME: Bradley CraftsKenneth Nielsen    MR#:  [id number]  DATE OF BIRTH:  April 07, 1956  SUBJECTIVE:  CHIEF COMPLAINT:   Chief Complaint  Patient presents with  . Chest Pain    Chest pain, A fib, CHF- EF 20%, remains anxious.   He used BiPAP last night and felt comfortable with that, still short of breath this morning.   Heart rate is much better under control with amiodarone IV and Cardizem IV drip now.  REVIEW OF SYSTEMS:  CONSTITUTIONAL: No fever, fatigue or weakness.  EYES: No blurred or double vision.  EARS, NOSE, AND THROAT: No tinnitus or ear pain.  RESPIRATORY: No cough, shortness of breath, wheezing or hemoptysis.  CARDIOVASCULAR: No chest pain, orthopnea, edema.  GASTROINTESTINAL: No nausea, vomiting, diarrhea or abdominal pain.  GENITOURINARY: No dysuria, hematuria.  ENDOCRINE: No polyuria, nocturia,  HEMATOLOGY: No anemia, easy bruising or bleeding SKIN: No rash or lesion. MUSCULOSKELETAL: No joint pain or arthritis.   NEUROLOGIC: No tingling, numbness, weakness.  PSYCHIATRY: No anxiety or depression.   ROS  DRUG ALLERGIES:  No Known Allergies  VITALS:  Blood pressure 108/68, pulse 84, temperature 98.4 F (36.9 C), temperature source Oral, resp. rate 39, height 5\' 8"  (1.727 m), weight 136.079 kg (300 lb), SpO2 98 %.  PHYSICAL EXAMINATION:   GENERAL: 60 y.o.-year-old Obese patient lying in the bed with some acute distress.  EYES: Pupils equal, round, reactive to light and accommodation. No scleral icterus. Extraocular muscles intact.  HEENT: Head atraumatic, normocephalic. Oropharynx and nasopharynx clear.  NECK: Supple, no jugular venous distention. No thyroid enlargement, no tenderness.  LUNGS: Normal breath sounds bilaterally, no wheezing, some crepitation. No use of accessory muscles of respiration.  CARDIOVASCULAR: S1, S2 normal. Tachycardia, No murmurs, rubs, or gallops.  ABDOMEN: Soft, nontender,  nondistended. Bowel sounds present. No organomegaly or mass.  EXTREMITIES: positive for pedal edema, cyanosis, or clubbing.  NEUROLOGIC: Cranial nerves II through XII are intact. Muscle strength 5/5 in all extremities. Sensation intact. Gait not checked.  PSYCHIATRIC: The patient is alert and oriented x 3.  SKIN: No obvious rash, lesion, or ulcer.   Physical Exam LABORATORY PANEL:   CBC  Recent Labs Lab 11/19/15 0453  WBC 18.1*  HGB 13.7  HCT 41.1  PLT 159   ------------------------------------------------------------------------------------------------------------------  Chemistries   Recent Labs Lab 11/19/15 0453  NA 139  K 3.7  CL 103  CO2 28  GLUCOSE 120*  BUN 20  CREATININE 1.35*  CALCIUM 8.0*   ------------------------------------------------------------------------------------------------------------------  Cardiac Enzymes  Recent Labs Lab 11/18/15 1719 11/18/15 2116  TROPONINI 4.57* 5.25*   ------------------------------------------------------------------------------------------------------------------  RADIOLOGY:  Dg Chest Port 1 View  11/19/2015  CLINICAL DATA:  Acute respiratory failure. He was in AFIB yesterday at a urgent care with SOB. Hx of cardiac cath. Non smoker. EXAM: PORTABLE CHEST 1 VIEW COMPARISON:  11/30/1999 FINDINGS: Heart is enlarged. There is pulmonary vascular congestion and probable interstitial edema. No focal consolidations. Possible small effusions. IMPRESSION: Cardiomegaly and mild edema.  Possible bilateral pleural effusions. Electronically Signed   By: Norva PavlovElizabeth  Brown M.D.   On: 11/19/2015 17:12    ASSESSMENT AND PLAN:   Principal Problem:   NSTEMI (non-ST elevated myocardial infarction) (HCC)  * Non-ST elevation MI Troponin is 3.  secondary to stress due to A. fib with RVR.  Cardiologist suggested to start on heparin drip and proceed to cardiac catheterization stent placed.   * Atrial fibrillation with rapid  ventricular  response  Responded to Cardizem injection, currently on amiodarone drip.  checked TSH.  Management per cardiologist.  EF is < 20% - cardioversion done and suggested to keep on BiPAP to help sleep apnea.   Cardioversion was done 11/19/15- also added on Cardizem IV drip.   Now in normal sinus rhythm.  * Acute systolic congestive heart failure   On IV Lasix, cardiologist is following.    * Dehydration  We'll give IV fluid and hydrate him well as he is going for catheterization today.  Mild worsening in renal function compared to previous records.  Monitor after catheterization.  * Anxiety   IV ativan PRN.  All the records are reviewed and case discussed with Care Management/Social Workerr. Management plans discussed with the patient, family and they are in agreement.  CODE STATUS: FUll.  TOTAL TIME TAKING CARE OF THIS PATIENT: 45 critical care minutes.   POSSIBLE D/C IN 2-3 DAYS, DEPENDING ON CLINICAL CONDITION.   Altamese Dilling M.D on 11/20/2015   Between 7am to 6pm - Pager - [phone]  After 6pm go to [email or web address] - Social research officer, government  [facility]  Office  [phone]  CC: Primary care physician; No PCP Per Patient  Note: This dictation was prepared with Dragon dictation along with smaller phrase technology. Any transcriptional errors that result from this process are unintentional.

## 2015-11-20 NOTE — Progress Notes (Addendum)
[employer] - [place] at [facility]   PATIENT NAME: Bradley Nielsen    MR#:  [id number]  DATE OF BIRTH:  10-06-1955  SUBJECTIVE:  CHIEF COMPLAINT:   Chief Complaint  Patient presents with  . Chest Pain    Chest pain, A fib, CHF- EF 20%, remains anxious.  REVIEW OF SYSTEMS:  CONSTITUTIONAL: No fever, fatigue or weakness.  EYES: No blurred or double vision.  EARS, NOSE, AND THROAT: No tinnitus or ear pain.  RESPIRATORY: No cough, shortness of breath, wheezing or hemoptysis.  CARDIOVASCULAR: No chest pain, orthopnea, edema.  GASTROINTESTINAL: No nausea, vomiting, diarrhea or abdominal pain.  GENITOURINARY: No dysuria, hematuria.  ENDOCRINE: No polyuria, nocturia,  HEMATOLOGY: No anemia, easy bruising or bleeding SKIN: No rash or lesion. MUSCULOSKELETAL: No joint pain or arthritis.   NEUROLOGIC: No tingling, numbness, weakness.  PSYCHIATRY: No anxiety or depression.   ROS  DRUG ALLERGIES:  No Known Allergies  VITALS:  Blood pressure 110/77, pulse 76, temperature 98.3 F (36.8 C), temperature source Axillary, resp. rate 22, height  (1.727 m), weight 136.079 kg (300 lb), SpO2 96 %.  PHYSICAL EXAMINATION:   GENERAL: 60 y.o.-year-old Obese patient lying in the bed with no acute distress.  EYES: Pupils equal, round, reactive to light and accommodation. No scleral icterus. Extraocular muscles intact.  HEENT: Head atraumatic, normocephalic. Oropharynx and nasopharynx clear.  NECK: Supple, no jugular venous distention. No thyroid enlargement, no tenderness.  LUNGS: Normal breath sounds bilaterally, no wheezing, rales,rhonchi or crepitation. No use of accessory muscles of respiration.  CARDIOVASCULAR: S1, S2 normal. Tachycardia, No murmurs, rubs, or gallops.  ABDOMEN: Soft, nontender, nondistended. Bowel sounds present. No organomegaly or mass.  EXTREMITIES: No pedal edema, cyanosis, or clubbing.  NEUROLOGIC: Cranial nerves II through XII are intact.  Muscle strength 5/5 in all extremities. Sensation intact. Gait not checked.  PSYCHIATRIC: The patient is alert and oriented x 3.  SKIN: No obvious rash, lesion, or ulcer.   Physical Exam LABORATORY PANEL:   CBC  Recent Labs Lab 11/19/15 0453  WBC 18.1*  HGB 13.7  HCT 41.1  PLT 159   ------------------------------------------------------------------------------------------------------------------  Chemistries   Recent Labs Lab 11/19/15 0453  NA 139  K 3.7  CL 103  CO2 28  GLUCOSE 120*  BUN 20  CREATININE 1.35*  CALCIUM 8.0*   ------------------------------------------------------------------------------------------------------------------  Cardiac Enzymes  Recent Labs Lab 11/18/15 1719 11/18/15 2116  TROPONINI 4.57* 5.25*   ------------------------------------------------------------------------------------------------------------------  RADIOLOGY:  Dg Chest Port 1 View  11/19/2015  CLINICAL DATA:  Acute respiratory failure. He was in AFIB yesterday at a urgent care with SOB. Hx of cardiac cath. Non smoker. EXAM: PORTABLE CHEST 1 VIEW COMPARISON:  11/30/1999 FINDINGS: Heart is enlarged. There is pulmonary vascular congestion and probable interstitial edema. No focal consolidations. Possible small effusions. IMPRESSION: Cardiomegaly and mild edema.  Possible bilateral pleural effusions. Electronically Signed   By: Norva Pavlov M.D.   On: 11/19/2015 17:12    ASSESSMENT AND PLAN:   Principal Problem:   NSTEMI (non-ST elevated myocardial infarction) (HCC)  * Non-ST elevation MI Troponin is 3.  secondary to stress due to A. fib with RVR.  Cardiologist suggested to start on heparin drip and proceed to cardiac catheterization stent placed.   * Atrial fibrillation with rapid ventricular response  Responded to Cardizem injection, currently on amiodarone drip.  check TSH.  Management per cardiologist.  EF is < 20% - cardioversion done and suggested  to keep on CPAP to help sleep apnea.  *  Dehydration  We'll give IV fluid and hydrate him well as he is going for catheterization today.  Mild worsening in renal function compared to previous records.  Monitor after catheterization.  * Anxiety   IV ativan PRN.  All the records are reviewed and case discussed with Care Management/Social Workerr. Management plans discussed with the patient, family and they are in agreement.  CODE STATUS: FUll.  TOTAL TIME TAKING CARE OF THIS PATIENT: 45 critical care minutes.   POSSIBLE D/C IN 2-3 DAYS, DEPENDING ON CLINICAL CONDITION.   Altamese DillingVACHHANI, Kei Mcelhiney M.D on 11/20/2015   Between 7am to 6pm - Pager - [phone]  After 6pm go to [email or web address] - Social research officer, governmentpassword EPAS [email or web address]  [facility] [place] [facility]  Office  [phone]  CC: Primary care physician; No PCP Per Patient  Note: This dictation was prepared with Dragon dictation along with smaller phrase technology. Any transcriptional errors that result from this process are unintentional.

## 2015-11-20 NOTE — Consult Note (Signed)
ANTICOAGULATION CONSULT NOTE - Initial Consult  Pharmacy Consult for Warfarin Indication: atrial fibrillation  No Known Allergies  Patient Measurements: Height: 5\' 8"  (172.7 cm) Weight: 300 lb (136.079 Bradley Nielsen) IBW/Bradley Nielsen (Calculated) : 68.4  Vital Signs: Temp: 98.8 F (37.1 C) (05/27 0400) Temp Source: Axillary (05/27 0400) BP: 121/77 mmHg (05/27 0900) Pulse Rate: 81 (05/27 0900)  Labs:  Recent Labs  11/18/15 0950 11/18/15 1719 11/18/15 2116 11/19/15 0453  HGB 14.6  --   --  13.7  HCT 43.4  --   --  41.1  PLT 185  --   --  159  APTT 32  --   --   --   LABPROT 15.3*  --   --   --   INR 1.19  --   --   --   HEPARINUNFRC  --  <0.10*  --   --   CREATININE 1.30*  --   --  1.35*  TROPONINI 3.35* 4.57* 5.25*  --     Estimated Creatinine Clearance: 80.6 mL/min (by C-G formula based on Cr of 1.35).   Medical History: Past Medical History  Diagnosis Date  . Chronic pain due to injury     Medications:  Scheduled:  . antiseptic oral rinse  7 mL Mouth Rinse BID  . aspirin  81 mg Oral Daily  . furosemide  40 mg Intravenous Daily  . gabapentin  300 mg Oral TID  . LORazepam  2 mg Intravenous Once  . metoprolol succinate  200 mg Oral Daily  . sodium chloride flush  3 mL Intravenous Q12H  . sodium chloride flush  3 mL Intravenous Q12H  . sodium chloride flush  3 mL Intravenous Q12H  . spironolactone  25 mg Oral Daily  . ticagrelor  90 mg Oral BID  . warfarin  5 mg Oral ONCE-1800  . Warfarin - Pharmacist Dosing Inpatient   Does not apply q1800    Assessment: Bradley Nielsen is a 60yo male with PMH significant for obesity, chronic back pain, now with new onset afib, NSTEMI s/p cardiac cath with BMS to the RCA, with refractory afib, EF ~25%. Pharmacy consulted to initiate warfarin in this patient for new onset Afib.  Goal of Therapy:  INR 2-3 Monitor platelets by anticoagulation protocol: Yes   Plan:  Pt is new start warfarin. INR goal 2-3 for Afib.  Patient is also on ticagrelor and  ASA 81mg . Pt is on amiodarone which can increase the effects of warfarin.   Due to risk of bleed and potential interaction, will initiate warfarin at 5mg  daily and will check INR in the morning.  Pharmacy will continue to monitor and adjust accordingly.  Rennis ChrisAllison K Edina Winningham 11/20/2015,10:20 AM

## 2015-11-20 NOTE — Progress Notes (Signed)
Respiratory came at 2030 to placed pt. On bi-pap, pt. Has removed bi-pap multiple times and it was replaced each time. Pt. Removed bi-pap the last time and requested it stay off. Respiratory notified of pt's. Request. Will continue to monitor pt.

## 2015-11-21 LAB — PROTIME-INR
INR: 1.36
Prothrombin Time: 16.9 seconds — ABNORMAL HIGH (ref 11.4–15.0)

## 2015-11-21 MED ORDER — ALPRAZOLAM 0.25 MG PO TABS: 0.5000 mg | ORAL_TABLET | Freq: Three times a day (TID) | ORAL | Status: DC | PRN

## 2015-11-21 MED ORDER — WARFARIN SODIUM 5 MG PO TABS
5.0000 mg | ORAL_TABLET | Freq: Once | ORAL | Status: AC
  Administered 2015-11-21: 5 mg via ORAL
  Filled 2015-11-21: qty 1

## 2015-11-21 NOTE — Progress Notes (Signed)
Pt. Removed bi-pap and prefers not to wear it. [place] reapplied at 2L. Pt educated on the importance of wearing the bi-pap. No SOB or acute distress noted. Will continue to monitor pt. Respiratory therapist notified.

## 2015-11-21 NOTE — Progress Notes (Signed)
SUBJECTIVE: Patient did not wear CPAP last night.   Filed Vitals:   11/20/15 1837 11/20/15 2012 11/21/15 0358 11/21/15 0800  BP: 125/51 91/62 99/48  118/88  Pulse: 92 91 84 84  Temp: 99.6 F (37.6 C) 98.5 F (36.9 C) 98.2 F (36.8 C) 98 F (36.7 C)  TempSrc: Oral Oral Oral Oral  Resp: 20 18 18 20   Height:      Weight:      SpO2: 96% 93% 97% 92%    Intake/Output Summary (Last 24 hours) at 11/21/15 1038 Last data filed at 11/21/15 1013  Gross per 24 hour  Intake    854 ml  Output   1345 ml  Net   -491 ml    LABS: Basic Metabolic Panel:  Recent Labs  16/03/9604/26/17 0453  NA 139  K 3.7  CL 103  CO2 28  GLUCOSE 120*  BUN 20  CREATININE 1.35*  CALCIUM 8.0*   Liver Function Tests: No results for input(s): AST, ALT, ALKPHOS, BILITOT, PROT, ALBUMIN in the last 72 hours. No results for input(s): LIPASE, AMYLASE in the last 72 hours. CBC:  Recent Labs  11/19/15 0453  WBC 18.1*  HGB 13.7  HCT 41.1  MCV 87.5  PLT 159   Cardiac Enzymes:  Recent Labs  11/18/15 1719 11/18/15 2116  TROPONINI 4.57* 5.25*   BNP: Invalid input(s): POCBNP D-Dimer: No results for input(s): DDIMER in the last 72 hours. Hemoglobin A1C: No results for input(s): HGBA1C in the last 72 hours. Fasting Lipid Panel:  Recent Labs  11/18/15 1719  CHOL 174  HDL 33*  LDLCALC 116*  TRIG 127  CHOLHDL 5.3   Thyroid Function Tests:  Recent Labs  11/18/15 1719  TSH 1.458   Anemia Panel: No results for input(s): VITAMINB12, FOLATE, FERRITIN, TIBC, IRON, RETICCTPCT in the last 72 hours.   PHYSICAL EXAM General: Well developed, well nourished, in no acute distress HEENT:  Normocephalic and atramatic Neck:  No JVD.  Lungs: Clear bilaterally to auscultation and percussion. Heart: HRRR . Normal S1 and S2 without gallops or murmurs.  Abdomen: Bowel sounds are positive, abdomen soft and non-tender  Msk:  Back normal, normal gait. Normal strength and tone for age. Extremities: No clubbing,  cyanosis or edema.   Neuro: Alert and oriented X 3. Psych:  Good affect, responds appropriately  TELEMETRY:Sinus rhythm  ASSESSMENT AND PLAN: Status post non-STEMI and status post drug-eluting stent in the mid RCA with atrial fibrillation and rapid ventricular response rate. Status post cardioversion and severe sleep apnea. Patient will go back to atrial fibrillation if he doesn't wear his CPAP and it has to be properly set up before he leaves for home . He he needs to have CPAP equipment and I talked to his nurse Darl PikesSusan as well as the respiratory therapist that it has to be arranged before he can be discharged.  Principal Problem:   NSTEMI (non-ST elevated myocardial infarction) (HCC)    Adrian BlackwaterKHAN,Ukiah Trawick A, MD, [facility] 11/21/2015 10:38 AM

## 2015-11-21 NOTE — Progress Notes (Signed)
[facility] [place] [facility]   PATIENT NAME: Bradley CraftsKenneth Nielsen    MR#:  [id number]  DATE OF BIRTH:  [birth year]  SUBJECTIVE:  CHIEF COMPLAINT:   Chief Complaint  Patient presents with  . Chest Pain    Chest pain, A fib, CHF- EF 20%, remains anxious.   He used BiPAP last night and felt comfortable with that, still short of breath this morning.   Heart rate is much better under control with amiodarone IV and Cardizem IV drip was given- switched to oral, HR is controlled now. Last night could not use Bipap as there was some problem with setting.  REVIEW OF SYSTEMS:  CONSTITUTIONAL: No fever, fatigue or weakness.  EYES: No blurred or double vision.  EARS, NOSE, AND THROAT: No tinnitus or ear pain.  RESPIRATORY: No cough, shortness of breath, wheezing or hemoptysis.  CARDIOVASCULAR: No chest pain, orthopnea, edema.  GASTROINTESTINAL: No nausea, vomiting, diarrhea or abdominal pain.  GENITOURINARY: No dysuria, hematuria.  ENDOCRINE: No polyuria, nocturia,  HEMATOLOGY: No anemia, easy bruising or bleeding SKIN: No rash or lesion. MUSCULOSKELETAL: No joint pain or arthritis.   NEUROLOGIC: No tingling, numbness, weakness.  PSYCHIATRY: No anxiety or depression.   ROS  DRUG ALLERGIES:  No Known Allergies  VITALS:  Blood pressure 121/77, pulse 75, temperature 97.6 F (36.4 C), temperature source Oral, resp. rate 20, height 5\' 8"  (1.727 m), weight 136.079 kg (300 lb), SpO2 97 %.  PHYSICAL EXAMINATION:   GENERAL: 60 y.o.-year-old Obese patient lying in the bed with no acute distress.  EYES: Pupils equal, round, reactive to light and accommodation. No scleral icterus. Extraocular muscles intact.  HEENT: Head atraumatic, normocephalic. Oropharynx and nasopharynx clear.  NECK: Supple, no jugular venous distention. No thyroid enlargement, no tenderness.  LUNGS: Normal breath sounds bilaterally, no wheezing, some crepitation. No use of accessory muscles of  respiration.  CARDIOVASCULAR: S1, S2 normal. Tachycardia, No murmurs, rubs, or gallops.  ABDOMEN: Soft, nontender, nondistended. Bowel sounds present. No organomegaly or mass.  EXTREMITIES: positive for pedal edema, cyanosis, or clubbing.  NEUROLOGIC: Cranial nerves II through XII are intact. Muscle strength 5/5 in all extremities. Sensation intact. Gait not checked.  PSYCHIATRIC: The patient is alert and oriented x 3.  SKIN: No obvious rash, lesion, or ulcer.   Physical Exam LABORATORY PANEL:   CBC  Recent Labs Lab 11/19/15 0453  WBC 18.1*  HGB 13.7  HCT 41.1  PLT 159   ------------------------------------------------------------------------------------------------------------------  Chemistries   Recent Labs Lab 11/19/15 0453  NA 139  K 3.7  CL 103  CO2 28  GLUCOSE 120*  BUN 20  CREATININE 1.35*  CALCIUM 8.0*   ------------------------------------------------------------------------------------------------------------------  Cardiac Enzymes  Recent Labs Lab 11/18/15 1719 11/18/15 2116  TROPONINI 4.57* 5.25*   ------------------------------------------------------------------------------------------------------------------  RADIOLOGY:  Dg Chest Port 1 View  11/19/2015  CLINICAL DATA:  Acute respiratory failure. He was in AFIB yesterday at a urgent care with SOB. Hx of cardiac cath. Non smoker. EXAM: PORTABLE CHEST 1 VIEW COMPARISON:  11/30/1999 FINDINGS: Heart is enlarged. There is pulmonary vascular congestion and probable interstitial edema. No focal consolidations. Possible small effusions. IMPRESSION: Cardiomegaly and mild edema.  Possible bilateral pleural effusions. Electronically Signed   By: Norva PavlovElizabeth  Brown M.D.   On: 11/19/2015 17:12    ASSESSMENT AND PLAN:   Principal Problem:   NSTEMI (non-ST elevated myocardial infarction) (HCC)  * Non-ST elevation MI Troponin is 3.  secondary to stress due to A. fib with RVR.  Cardiologist  suggested to start on heparin drip and proceed to cardiac catheterization stent placed.   * Atrial fibrillation with rapid ventricular response  Responded to Cardizem injection, currently on amiodarone drip.  checked TSH.  Management per cardiologist.  EF is < 20% - cardioversion done and suggested to keep on BiPAP to help sleep apnea.   Cardioversion was done 11/19/15- also added on Cardizem IV drip.   Now in normal sinus rhythm.   Switched to oral metoprolol and Coumadin- and monitoring on tele.  * Acute systolic congestive heart failure   On IV Lasix, cardiologist is following.    * Dehydration  We'll give IV fluid and hydrate him well as he is going for catheterization today.  Mild worsening in renal function compared to previous records.  Monitor after catheterization.  * Anxiety   IV ativan PRN.  All the records are reviewed and case discussed with Care Management/Social Workerr. Management plans discussed with the patient, family and they are in agreement.  CODE STATUS: FUll.  TOTAL TIME TAKING CARE OF THIS PATIENT: 45  minutes.   POSSIBLE D/C IN 2-3 DAYS, DEPENDING ON CLINICAL CONDITION. We can not arrnage his CPAP/ BIPAP over the weekend., so he will stay until then.  Altamese Dilling M.D on 11/21/2015   Between 7am to 6pm - Pager - [phone]  After 6pm go to [email or web address] - Social research officer, government  [facility]  Office  [phone]  CC: Primary care physician; No PCP Per Patient  Note: This dictation was prepared with Dragon dictation along with smaller phrase technology. Any transcriptional errors that result from this process are unintentional.

## 2015-11-21 NOTE — Care Management Note (Addendum)
Case Management Note  Patient Details  Name: Bradley Nielsen MRN: [id number] Date of Birth: March 14, 1956  Subjective/Objective:       Received a call from Mr Thomasene LotGreen's nurse reporting that he has obstructive sleep apnea and cannot be discharged without a CPAP. No sleep study on file. This Clinical research associatewriter called [facility] and was told that they do not set up CPAP's on the weekend and that a sleep study is required by Medicare. Text sent to Feliberto GottronJason Hinton at [facility] to please look at this possible CPAP on Monday 11/22/15.                Action/Plan:   Expected Discharge Date:                  Expected Discharge Plan:     In-House Referral:     Discharge planning Services     Post Acute Care Choice:    Choice offered to:     DME Arranged:    DME Agency:     HH Arranged:    HH Agency:     Status of Service:     Medicare Important Message Given:    Date Medicare IM Given:    Medicare IM give by:    Date Additional Medicare IM Given:    Additional Medicare Important Message give by:     If discussed at Long Length of Stay Meetings, dates discussed:    Additional Comments:  Gem Conkle A, RN 11/21/2015, 11:25 AM

## 2015-11-21 NOTE — Progress Notes (Signed)
Pt. Refused to use continuous pulse ox, respiratory notified. No SOB or acute distress. Will continue to monitor pt.

## 2015-11-21 NOTE — Progress Notes (Signed)
Bipap initiated with pressure lowered to 10/6.  Pt tolerating well at this time.

## 2015-11-21 NOTE — Progress Notes (Signed)
Patient resting intermittently overnight, remains on 3LNC, Will continue to  Monitor pt.

## 2015-11-21 NOTE — Consult Note (Signed)
ANTICOAGULATION CONSULT NOTE - Initial Consult  Pharmacy Consult for Warfarin Indication: atrial fibrillation  No Known Allergies  Patient Measurements: Height: 5\' 8"  (172.7 cm) Weight: 300 lb (136.079 kg) IBW/kg (Calculated) : 68.4  Vital Signs: Temp: 98 F (36.7 C) (05/28 0800) Temp Source: Oral (05/28 0800) BP: 118/88 mmHg (05/28 0800) Pulse Rate: 84 (05/28 0800)  Labs:  Recent Labs  11/18/15 0950 11/18/15 1719 11/18/15 2116 11/19/15 0453 11/21/15 0649  HGB 14.6  --   --  13.7  --   HCT 43.4  --   --  41.1  --   PLT 185  --   --  159  --   APTT 32  --   --   --   --   LABPROT 15.3*  --   --   --  16.9*  INR 1.19  --   --   --  1.36  HEPARINUNFRC  --  <0.10*  --   --   --   CREATININE 1.30*  --   --  1.35*  --   TROPONINI 3.35* 4.57* 5.25*  --   --     Estimated Creatinine Clearance: 80.6 mL/min (by C-G formula based on Cr of 1.35).   Medical History: Past Medical History  Diagnosis Date  . Chronic pain due to injury     Medications:  Scheduled:  . antiseptic oral rinse  7 mL Mouth Rinse BID  . aspirin  81 mg Oral Daily  . furosemide  40 mg Intravenous Daily  . gabapentin  300 mg Oral TID  . LORazepam  2 mg Intravenous Once  . metoprolol succinate  200 mg Oral Daily  . sodium chloride flush  3 mL Intravenous Q12H  . sodium chloride flush  3 mL Intravenous Q12H  . sodium chloride flush  3 mL Intravenous Q12H  . spironolactone  25 mg Oral Daily  . ticagrelor  90 mg Oral BID  . Warfarin - Pharmacist Dosing Inpatient   Does not apply q1800    Assessment: KG is a 60yo male with PMH significant for obesity, chronic back pain, now with new onset afib, NSTEMI s/p cardiac cath with BMS to the RCA, with refractory afib, EF ~25%. Pharmacy consulted to initiate warfarin in this patient for new onset Afib.  Goal of Therapy:  INR 2-3 Monitor platelets by anticoagulation protocol: Yes   Plan:  Patient is also on ticagrelor and ASA 81mg . Pt is on amiodarone  which can increase the effects of warfarin.   Initiated on warfarin 5mg  05/28 INR: 1.36  Will continue with current dose and check INR with AM labs.   Pharmacy will continue to monitor and adjust accordingly.  Cy BlamerAllison K Joyceline Maiorino 11/21/2015,9:28 AM

## 2015-11-22 ENCOUNTER — Encounter: Payer: Self-pay | Admitting: Cardiovascular Disease

## 2015-11-22 LAB — CBC
HCT: 41 % (ref 40.0–52.0)
Hemoglobin: 13.9 g/dL (ref 13.0–18.0)
MCH: 29.6 pg (ref 26.0–34.0)
MCHC: 34 g/dL (ref 32.0–36.0)
MCV: 87 fL (ref 80.0–100.0)
PLATELETS: 201 10*3/uL (ref 150–440)
RBC: 4.71 MIL/uL (ref 4.40–5.90)
RDW: 14.4 % (ref 11.5–14.5)
WBC: 11.8 10*3/uL — ABNORMAL HIGH (ref 3.8–10.6)

## 2015-11-22 LAB — PROTIME-INR
INR: 2
Prothrombin Time: 22.6 seconds — ABNORMAL HIGH (ref 11.4–15.0)

## 2015-11-22 MED ORDER — WARFARIN SODIUM 2.5 MG PO TABS: 2.5000 mg | ORAL_TABLET | Freq: Once | ORAL | Status: DC

## 2015-11-22 MED ORDER — SACUBITRIL-VALSARTAN 24-26 MG PO TABS
1.0000 | ORAL_TABLET | Freq: Two times a day (BID) | ORAL | Status: DC
  Administered 2015-11-22: 1 via ORAL
  Filled 2015-11-22: qty 1

## 2015-11-22 MED ORDER — TICAGRELOR 90 MG PO TABS: 90.0000 mg | ORAL_TABLET | Freq: Two times a day (BID) | ORAL | Status: AC

## 2015-11-22 MED ORDER — WARFARIN SODIUM 3 MG PO TABS: 3.0000 mg | ORAL_TABLET | Freq: Every day | ORAL | Status: AC

## 2015-11-22 MED ORDER — ASPIRIN 81 MG PO CHEW: 81.0000 mg | CHEWABLE_TABLET | Freq: Every day | ORAL | Status: AC

## 2015-11-22 MED ORDER — METOPROLOL SUCCINATE ER 200 MG PO TB24: 200.0000 mg | ORAL_TABLET | Freq: Every day | ORAL | Status: AC

## 2015-11-22 MED ORDER — ALPRAZOLAM 0.25 MG PO TABS: 0.2500 mg | ORAL_TABLET | Freq: Three times a day (TID) | ORAL | Status: AC | PRN

## 2015-11-22 MED ORDER — FUROSEMIDE 20 MG PO TABS: 20.0000 mg | ORAL_TABLET | Freq: Two times a day (BID) | ORAL | Status: AC

## 2015-11-22 MED ORDER — SPIRONOLACTONE 25 MG PO TABS: 25.0000 mg | ORAL_TABLET | Freq: Every day | ORAL | Status: AC

## 2015-11-22 MED ORDER — SACUBITRIL-VALSARTAN 24-26 MG PO TABS: 1.0000 | ORAL_TABLET | Freq: Two times a day (BID) | ORAL | Status: AC

## 2015-11-22 NOTE — Care Management (Addendum)
Spoke with wife to discuss home CPAP and home health RN. She has no agency preference from choices provided.  Patient has a sleep study scheduled for December 24, 2015 at Dr, Laqueta DueKahns office. [facility] [place] [facility]. Provided coupons for Brilinta and Entresto. Wife and patient appreciative. Patient does not qualify for home O2. Scheduled patient with a new PCP at [facility] for Wed. May 31, @ 1030. Wife updated with date and time. Placed on AVS.

## 2015-11-22 NOTE — Progress Notes (Signed)
SUBJECTIVE: No chest pain or shortness of breath but did not wear CPAP all night he says he tried but somehow he took it off without knowing it.    Filed Vitals:   11/21/15 1135 11/21/15 1949 11/21/15 2155 11/22/15 0553  BP: 121/77 143/88  107/77  Pulse: 75 83  80  Temp: 97.6 F (36.4 C) 97.5 F (36.4 C)  98.3 F (36.8 C)  TempSrc:  Oral  Oral  Resp: 20 18  16   Height:      Weight:      SpO2: 97% 94% 94% 93%    Intake/Output Summary (Last 24 hours) at 11/22/15 0910 Last data filed at 11/22/15 [id number]  Gross per 24 hour  Intake    720 ml  Output    600 ml  Net    120 ml    LABS: Basic Metabolic Panel: No results for input(s): NA, K, CL, CO2, GLUCOSE, BUN, CREATININE, CALCIUM, MG, PHOS in the last 72 hours. Liver Function Tests: No results for input(s): AST, ALT, ALKPHOS, BILITOT, PROT, ALBUMIN in the last 72 hours. No results for input(s): LIPASE, AMYLASE in the last 72 hours. CBC:  Recent Labs  11/22/15 0800  WBC 11.8*  HGB 13.9  HCT 41.0  MCV 87.0  PLT 201   Cardiac Enzymes: No results for input(s): CKTOTAL, CKMB, CKMBINDEX, TROPONINI in the last 72 hours. BNP: Invalid input(s): POCBNP D-Dimer: No results for input(s): DDIMER in the last 72 hours. Hemoglobin A1C: No results for input(s): HGBA1C in the last 72 hours. Fasting Lipid Panel: No results for input(s): CHOL, HDL, LDLCALC, TRIG, CHOLHDL, LDLDIRECT in the last 72 hours. Thyroid Function Tests: No results for input(s): TSH, T4TOTAL, T3FREE, THYROIDAB in the last 72 hours.  Invalid input(s): FREET3 Anemia Panel: No results for input(s): VITAMINB12, FOLATE, FERRITIN, TIBC, IRON, RETICCTPCT in the last 72 hours.   PHYSICAL EXAM General: Well developed, well nourished, in no acute distress HEENT:  Normocephalic and atramatic Neck:  No JVD.  Lungs: Clear bilaterally to auscultation and percussion. Heart: HRRR . Normal S1 and S2 without gallops or murmurs.  Abdomen: Bowel sounds are positive, abdomen  soft and non-tender  Msk:  Back normal, normal gait. Normal strength and tone for age. Extremities: No clubbing, cyanosis or edema.   Neuro: Alert and oriented X 3. Psych:  Good affect, responds appropriately  TELEMETRY: Sinus rhythm 87 bpm  ASSESSMENT AND PLAN: Cardiomyopathy with left ventricle ejection fraction 25-30% status post PCI and stent stenting of the mid RCA with drug-eluting stent. Patient had cardioversion for atrial fibrillation and during that she was having severe apneic spells all suggestive of severe sleep apnea. Patient needs to be set up for CPAP prior to discharge. If CPAP is set up today he can go home with follow-up in the office tomorrow at 2:00.  Principal Problem:   NSTEMI (non-ST elevated myocardial infarction) (HCC)    Bradley Nielsen A, MD, [facility] 11/22/2015 9:10 AM

## 2015-11-22 NOTE — Care Management Important Message (Signed)
Important Message  Patient Details  Name: Bradley Nielsen MRN: [id number] Date of Birth: November 30, 1955   Medicare Important Message Given:  Yes    Olegario MessierKathy A Precious Gilchrest 11/22/2015, 2:14 PM

## 2015-11-22 NOTE — Discharge Instructions (Signed)
Fluid restriction up to 1500 ml daily Low salt diet. Daily weigh your self, if > 2 Lb gain in a day or > 5 Lb in 1 week- take lasix 20 mg 3 times a day for 2 days- and still not able to get rid of extra weight- call your doctor or cardiologist.

## 2015-11-22 NOTE — Consult Note (Signed)
ANTICOAGULATION CONSULT NOTE - Initial Consult  Pharmacy Consult for Warfarin Indication: atrial fibrillation  No Known Allergies  Patient Measurements: Height: 5\' 8"  (172.7 cm) Weight: 300 lb (136.079 Bradley Nielsen) IBW/Bradley Nielsen (Calculated) : 68.4  Vital Signs: Temp: 98.3 F (36.8 C) (05/29 0553) Temp Source: Oral (05/29 0553) BP: 107/77 mmHg (05/29 0553) Pulse Rate: 80 (05/29 0553)  Labs:  Recent Labs  11/21/15 0649 11/22/15 0800  HGB  --  13.9  HCT  --  41.0  PLT  --  201  LABPROT 16.9* 22.6*  INR 1.36 2.00    Estimated Creatinine Clearance: 80.6 mL/min (by C-G formula based on Cr of 1.35).   Medical History: Past Medical History  Diagnosis Date  . Chronic pain due to injury     Medications:  Scheduled:  . antiseptic oral rinse  7 mL Mouth Rinse BID  . aspirin  81 mg Oral Daily  . furosemide  40 mg Intravenous Daily  . gabapentin  300 mg Oral TID  . LORazepam  2 mg Intravenous Once  . metoprolol succinate  200 mg Oral Daily  . sacubitril-valsartan  1 tablet Oral BID  . sodium chloride flush  3 mL Intravenous Q12H  . sodium chloride flush  3 mL Intravenous Q12H  . sodium chloride flush  3 mL Intravenous Q12H  . spironolactone  25 mg Oral Daily  . ticagrelor  90 mg Oral BID  . Warfarin - Pharmacist Dosing Inpatient   Does not apply q1800    Assessment: Bradley Nielsen is a 60yo male with PMH significant for obesity, chronic back pain, now with new onset afib, NSTEMI s/p cardiac cath with BMS to the RCA, with refractory afib, EF ~25%. Pharmacy consulted to initiate warfarin in this patient for new onset Afib.  Goal of Therapy:  INR 2-3 Monitor platelets by anticoagulation protocol: Yes   Plan:  Patient is also on ticagrelor and ASA 81mg . Pt is on amiodarone which can increase the effects of warfarin.   05/27 Initiated on warfarin 5mg  05/28 INR: 1.36, warfarin 5mg  05/29 INR: 2.0  INR therapeutic after only 2 doses of warfarin. Will reduce dose to 2.5mg  and recheck with AM  labs.   Bradley Nielsen, PharmD Clinical Pharmacist  11/22/2015 10:39 AM

## 2015-11-22 NOTE — Discharge Summary (Signed)
[facility] [place] [facility]   PATIENT NAME: Bradley CraftsKenneth Sacca    MR#:  [id number]  DATE OF BIRTH:  1955/08/13  DATE OF ADMISSION:  11/18/2015 ADMITTING PHYSICIAN: Laurier NancyShaukat A Khan, MD  DATE OF DISCHARGE: 11/22/2015  PRIMARY CARE PHYSICIAN: No PCP Per Patient    ADMISSION DIAGNOSIS:  Chest pressure [R07.89] Elevated troponin [R79.89] Atrial fibrillation with RVR (HCC) [I48.91]  DISCHARGE DIAGNOSIS:  Principal Problem:   NSTEMI (non-ST elevated myocardial infarction) (HCC)    Acute systolic CHF,    A fib  SECONDARY DIAGNOSIS:   Past Medical History  Diagnosis Date  . Chronic pain due to injury     HOSPITAL COURSE:   * Non-ST elevation MI Troponin is 3.  secondary to stress due to A. fib with RVR.  Cardiologist suggested to start on heparin drip and proceed to cardiac catheterization stent placed.   D/c on brilianta and ASA.   * Atrial fibrillation with rapid ventricular response  Responded to Cardizem injection, placed on amiodarone drip.  checked TSH.  Management per cardiologist.  EF is < 20% - cardioversion done and suggested to keep on BiPAP to help sleep apnea.  Cardioversion was done 11/19/15- also added on Cardizem IV drip.  Now in normal sinus rhythm.  Switched to oral metoprolol and Coumadin- and monitoring on tele.   HR stable in sinus rhythm.- arranging for C PAP at home.  * Acute systolic congestive heart failure  On IV Lasix, cardiologist is following.   Stable- d/c on oral lasix.   * Dehydration  resolved after mild IV fluids on admission, stable.  * Anxiety  IV ativan PRN.  DISCHARGE CONDITIONS:   Stable.  CONSULTS OBTAINED:  Treatment Team:  Laurier NancyShaukat A Khan, MD  DRUG ALLERGIES:  No Known Allergies  DISCHARGE MEDICATIONS:   Current Discharge Medication List    START taking these medications   Details  ALPRAZolam (XANAX) 0.25 MG tablet Take 1 tablet (0.25 mg total) by mouth 3 (three)  times daily as needed for anxiety. Qty: 30 tablet, Refills: 0    aspirin 81 MG chewable tablet Chew 1 tablet (81 mg total) by mouth daily. Qty: 30 tablet, Refills: 0    furosemide (LASIX) 20 MG tablet Take 1 tablet (20 mg total) by mouth 2 (two) times daily. Qty: 60 tablet, Refills: 0    metoprolol succinate (TOPROL-XL) 200 MG 24 hr tablet Take 1 tablet (200 mg total) by mouth daily. Take with or immediately following a meal. Qty: 30 tablet, Refills: 0    sacubitril-valsartan (ENTRESTO) 24-26 MG Take 1 tablet by mouth 2 (two) times daily. Qty: 60 tablet, Refills: 0    spironolactone (ALDACTONE) 25 MG tablet Take 1 tablet (25 mg total) by mouth daily. Qty: 30 tablet, Refills: 0    ticagrelor (BRILINTA) 90 MG TABS tablet Take 1 tablet (90 mg total) by mouth 2 (two) times daily. Qty: 60 tablet, Refills: 0    warfarin (COUMADIN) 3 MG tablet Take 1 tablet (3 mg total) by mouth daily. Qty: 15 tablet, Refills: 0      CONTINUE these medications which have NOT CHANGED   Details  gabapentin (NEURONTIN) 300 MG capsule Take 300 mg by mouth 3 (three) times daily.    HYDROcodone-acetaminophen (NORCO) 10-325 MG tablet Take 1 tablet by mouth every 6 (six) hours as needed.    zolpidem (AMBIEN) 10 MG tablet Take 10 mg by mouth at bedtime as needed for sleep.      STOP  taking these medications     diclofenac (VOLTAREN) 75 MG EC tablet          DISCHARGE INSTRUCTIONS:    Follow with cardiology clinic in 1-2 days and check INR to adjust dose of Coumadin.  If you experience worsening of your admission symptoms, develop shortness of breath, life threatening emergency, suicidal or homicidal thoughts you must seek medical attention immediately by calling 911 or calling your MD immediately  if symptoms less severe.  You Must read complete instructions/literature along with all the possible adverse reactions/side effects for all the Medicines you take and that have been prescribed to you. Take  any new Medicines after you have completely understood and accept all the possible adverse reactions/side effects.   Please note  You were cared for by a hospitalist during your hospital stay. If you have any questions about your discharge medications or the care you received while you were in the hospital after you are discharged, you can call the unit and asked to speak with the hospitalist on call if the hospitalist that took care of you is not available. Once you are discharged, your primary care physician will handle any further medical issues. Please note that NO REFILLS for any discharge medications will be authorized once you are discharged, as it is imperative that you return to your primary care physician (or establish a relationship with a primary care physician if you do not have one) for your aftercare needs so that they can reassess your need for medications and monitor your lab values.    Today   CHIEF COMPLAINT:   Chief Complaint  Patient presents with  . Chest Pain    HISTORY OF PRESENT ILLNESS:  Jabarie Pop  is a 60 y.o. male with a known history of Chronic pain at the back because of injuries and surgeries. For last 1 week he started having chest tightness and episodes of excessive sweating and feeling very weak. He could not sleep at nighttime flat in the bed and he has to stay in the recliner for last 1 week. As the problem was getting worse he went to urgent care Center today, where he was noted to have a heart rate running more than 200 and they gave injection amiodarone which helped to slow it down to 120 and sent him to emergency room for further workup.  He does not follow regularly with her primary care physician but he follows with his neurologist and said that they checked him routinely for his blood pressure and it was always reported to be under control.  Patient also has some complain of constipation for last few days, he denies any urinary symptoms, cough,  fever, sputum production. He was able to perform his routine weightbearing and other exercise at gym for last 2 days without any worsening in the chest pain. Though he does not do treadmill or any walking exercise because of his back and knee issues.   VITAL SIGNS:  Blood pressure 140/87, pulse 77, temperature 97.9 F (36.6 C), temperature source Oral, resp. rate 21, height  (1.727 m), weight 136.079 kg (300 lb), SpO2 97 %.  I/O:   Intake/Output Summary (Last 24 hours) at 11/22/15 1223 Last data filed at 11/22/15 1007  Gross per 24 hour  Intake    720 ml  Output    200 ml  Net    520 ml    PHYSICAL EXAMINATION:   GENERAL: 60 y.o.-year-old Obese patient lying in the bed with  no acute distress.  EYES: Pupils equal, round, reactive to light and accommodation. No scleral icterus. Extraocular muscles intact.  HEENT: Head atraumatic, normocephalic. Oropharynx and nasopharynx clear.  NECK: Supple, no jugular venous distention. No thyroid enlargement, no tenderness.  LUNGS: Normal breath sounds bilaterally, no wheezing, some crepitation. No use of accessory muscles of respiration.  CARDIOVASCULAR: S1, S2 normal. Tachycardia, No murmurs, rubs, or gallops.  ABDOMEN: Soft, nontender, nondistended. Bowel sounds present. No organomegaly or mass.  EXTREMITIES: positive for pedal edema, cyanosis, or clubbing.  NEUROLOGIC: Cranial nerves II through XII are intact. Muscle strength 5/5 in all extremities. Sensation intact. Gait not checked.  PSYCHIATRIC: The patient is alert and oriented x 3.  SKIN: No obvious rash, lesion, or ulcer.   DATA REVIEW:   CBC  Recent Labs Lab 11/22/15 0800  WBC 11.8*  HGB 13.9  HCT 41.0  PLT 201    Chemistries   Recent Labs Lab 11/19/15 0453  NA 139  K 3.7  CL 103  CO2 28  GLUCOSE 120*  BUN 20  CREATININE 1.35*  CALCIUM 8.0*    Cardiac Enzymes  Recent Labs Lab 11/18/15 2116  TROPONINI 5.25*    Microbiology Results   Results for orders placed or performed during the hospital encounter of 11/18/15  MRSA PCR Screening     Status: None   Collection Time: 11/18/15  6:00 PM  Result Value Ref Range Status   MRSA by PCR NEGATIVE NEGATIVE Final    Comment:        The GeneXpert MRSA Assay (FDA approved for NASAL specimens only), is one component of a comprehensive MRSA colonization surveillance program. It is not intended to diagnose MRSA infection nor to guide or monitor treatment for MRSA infections.     RADIOLOGY:  No results found.  EKG:   Orders placed or performed during the hospital encounter of 11/18/15  . ED EKG  . ED EKG  . EKG 12-Lead  . EKG 12-Lead  . EKG 12-Lead immediately post procedure  . EKG 12-Lead  . EKG 12-Lead immediately post procedure  . EKG 12-Lead  . EKG 12-Lead  . EKG 12-Lead  . EKG 12-Lead  . EKG 12-Lead      Management plans discussed with the patient, family and they are in agreement.  CODE STATUS:     Code Status Orders        Start     Ordered   11/18/15 1554  Full code   Continuous     11/18/15 1553    Code Status History    Date Active Date Inactive Code Status Order ID Comments User Context   11/18/2015  3:28 PM 11/18/2015  3:53 PM Full Code [id number]  Alwyn Pea, MD Inpatient      TOTAL TIME TAKING CARE OF THIS PATIENT: 35 minutes.    Altamese Dilling M.D on 11/22/2015 at 12:23 PM  Between 7am to 6pm - Pager - [phone]  After 6pm go to [email or web address] - Social research officer, government  [facility]  Office  [phone]  CC: Primary care physician; No PCP Per Patient   Note: This dictation was prepared with Dragon dictation along with smaller phrase technology. Any transcriptional errors that result from this process are unintentional.

## 2016-08-30 ENCOUNTER — Ambulatory Visit
Admission: RE | Admit: 2016-08-30 | Discharge: 2016-08-30 | Disposition: A | Discharge status: Home / Self Care | Payer: Medicare Other | Source: Ambulatory Visit | Attending: Neurosurgery | Admitting: Neurosurgery

## 2016-08-30 ENCOUNTER — Other Ambulatory Visit: Payer: Self-pay | Admitting: Neurosurgery

## 2016-08-30 DIAGNOSIS — M47816 Spondylosis without myelopathy or radiculopathy, lumbar region: Secondary | ICD-10-CM | POA: Diagnosis not present

## 2016-08-30 DIAGNOSIS — M4327 Fusion of spine, lumbosacral region: Secondary | ICD-10-CM | POA: Diagnosis not present

## 2017-11-23 ENCOUNTER — Other Ambulatory Visit
Admission: RE | Admit: 2017-11-23 | Discharge: 2017-11-23 | Disposition: A | Discharge status: Home / Self Care | Payer: Medicare Other | Source: Ambulatory Visit | Attending: Vascular Surgery | Admitting: Vascular Surgery

## 2017-11-23 DIAGNOSIS — I509 Heart failure, unspecified: Secondary | ICD-10-CM | POA: Insufficient documentation

## 2017-11-23 LAB — BASIC METABOLIC PANEL
ANION GAP: 14 (ref 5–15)
BUN: 41 mg/dL — ABNORMAL HIGH (ref 6–20)
CALCIUM: 9.1 mg/dL (ref 8.9–10.3)
CO2: 30 mmol/L (ref 22–32)
Chloride: 91 mmol/L — ABNORMAL LOW (ref 101–111)
Creatinine, Ser: 1.87 mg/dL — ABNORMAL HIGH (ref 0.61–1.24)
GFR calc Af Amer: 43 mL/min — ABNORMAL LOW (ref >60)
GFR, EST NON AFRICAN AMERICAN: 37 mL/min — AB (ref >60)
GLUCOSE: 115 mg/dL — AB (ref 65–99)
POTASSIUM: 3 mmol/L — AB (ref 3.5–5.1)
Sodium: 135 mmol/L (ref 135–145)

## 2018-02-24 DEATH — deceased

## 2018-07-14 IMAGING — CR DG LUMBAR SPINE 2-3V
1 series · 2 of 2 positions shown · non-contrast
Comparison: CT lumbar spine of 04/16/2012

CLINICAL DATA: Fell 10 days ago now with low back pain radiating to
the right leg

EXAM:
LUMBAR SPINE - 2-3 VIEW

[Series 1: dg lumbar spine 2-3 views · 0.14mm/px · 2 of 2 slices shown]
[im 1/2]
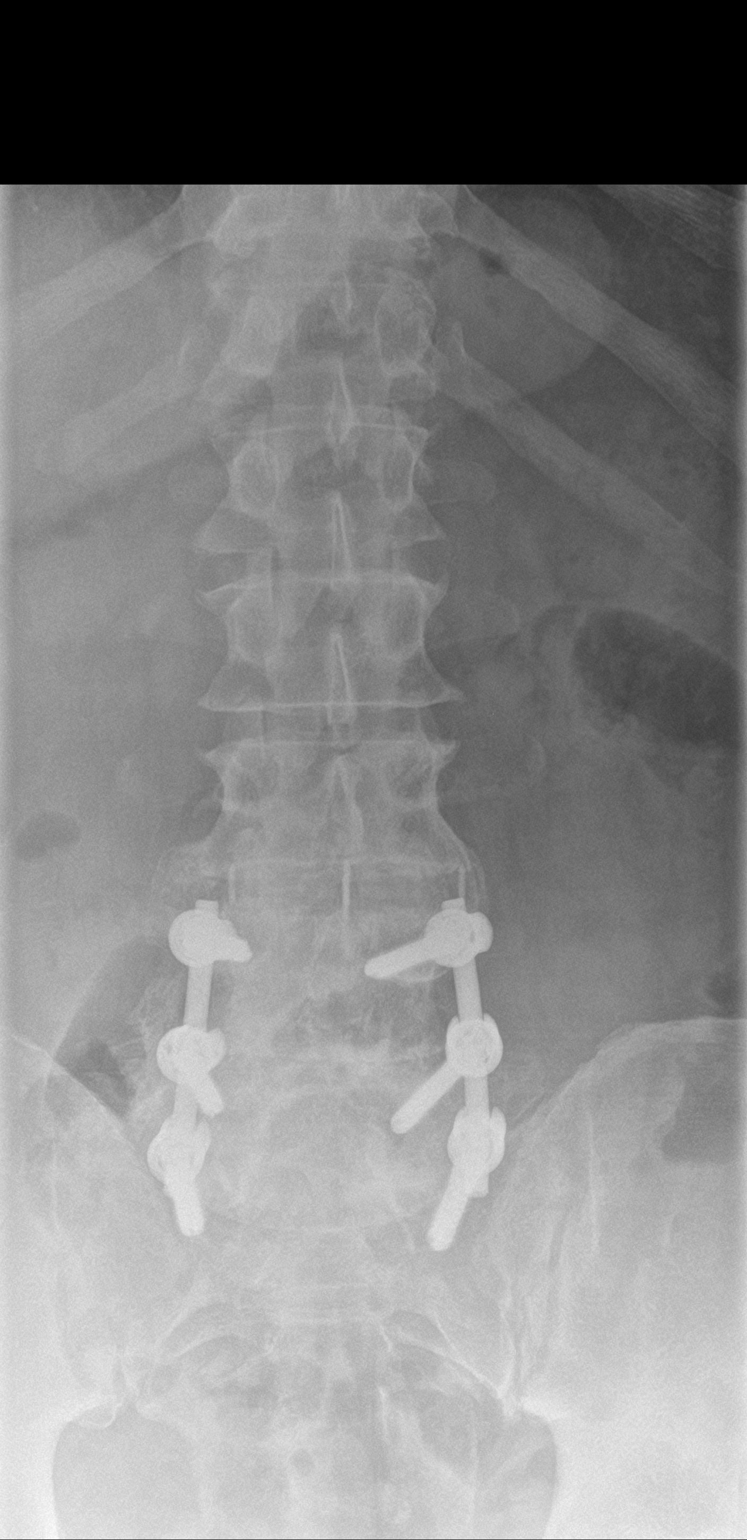
[im 2/2]
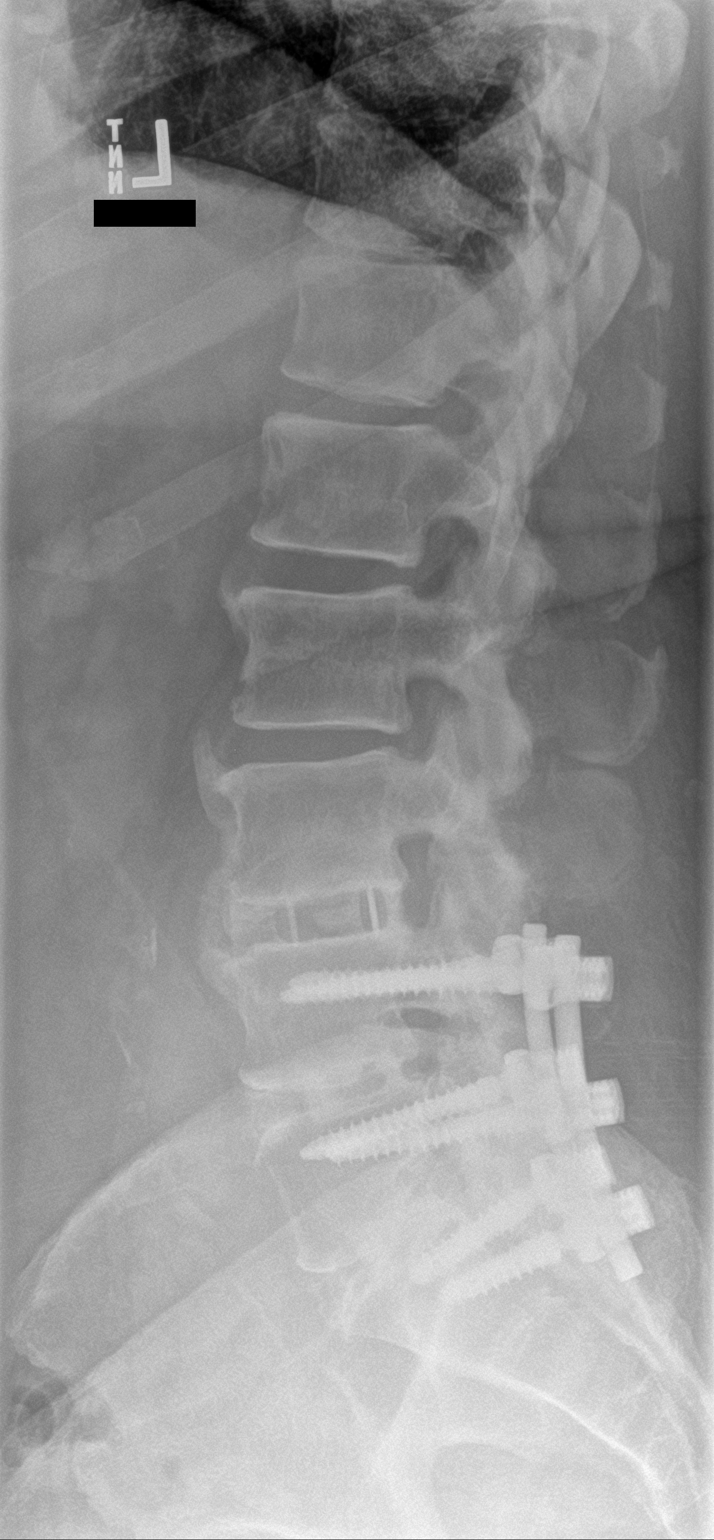

[2 of 2 positions shown; findings below may reference images not displayed]

FINDINGS: The lumbar vertebrae remain in normal alignment. Hardware for fusion
remains from L4-S1. Interbody fusion plugs are unchanged in position
at L3-4, L4-5, and L5-S1 levels. No acute abnormality is seen.
IMPRESSION: Stable posterior fusion hardware from L4-S1 with interbody fusion
plugs at L3-4, L4-5, and L5-S1. No acute abnormality
